# Patient Record
Sex: Male | Born: 1960 | Race: Black or African American | Hispanic: No | Marital: Married | Smoking: Former smoker
Health system: Southern US, Community
[De-identification: ages and names within clinical notes are randomized; demographics above are authoritative.]

## PROBLEM LIST (undated history)

## (undated) DIAGNOSIS — E785 Hyperlipidemia, unspecified: Secondary | ICD-10-CM

## (undated) DIAGNOSIS — I10 Essential (primary) hypertension: Secondary | ICD-10-CM

## (undated) DIAGNOSIS — R011 Cardiac murmur, unspecified: Secondary | ICD-10-CM

## (undated) DIAGNOSIS — M1990 Unspecified osteoarthritis, unspecified site: Secondary | ICD-10-CM

## (undated) DIAGNOSIS — K429 Umbilical hernia without obstruction or gangrene: Secondary | ICD-10-CM

## (undated) HISTORY — DX: Hyperlipidemia, unspecified: E78.5

## (undated) HISTORY — PX: APPENDECTOMY: SHX54

## (undated) HISTORY — DX: Unspecified osteoarthritis, unspecified site: M19.90

---

## 2002-05-10 ENCOUNTER — Emergency Department (HOSPITAL_COMMUNITY): Admission: EM | Admit: 2002-05-10 | Discharge: 2002-05-10 | Payer: Self-pay | Admitting: Internal Medicine

## 2002-05-10 ENCOUNTER — Encounter: Payer: Self-pay | Admitting: Internal Medicine

## 2003-09-27 ENCOUNTER — Ambulatory Visit (HOSPITAL_COMMUNITY): Admission: RE | Admit: 2003-09-27 | Discharge: 2003-09-27 | Payer: Self-pay | Admitting: Family Medicine

## 2003-09-27 ENCOUNTER — Encounter: Payer: Self-pay | Admitting: Family Medicine

## 2006-12-08 ENCOUNTER — Ambulatory Visit: Payer: Self-pay | Admitting: Internal Medicine

## 2006-12-08 ENCOUNTER — Ambulatory Visit (HOSPITAL_COMMUNITY): Admission: RE | Admit: 2006-12-08 | Discharge: 2006-12-08 | Payer: Self-pay | Admitting: Internal Medicine

## 2006-12-08 HISTORY — PX: COLONOSCOPY: SHX174

## 2009-02-19 ENCOUNTER — Ambulatory Visit (HOSPITAL_COMMUNITY): Admission: RE | Admit: 2009-02-19 | Discharge: 2009-02-19 | Payer: Self-pay | Admitting: Family Medicine

## 2009-02-26 ENCOUNTER — Ambulatory Visit (HOSPITAL_COMMUNITY): Admission: RE | Admit: 2009-02-26 | Discharge: 2009-02-26 | Payer: Self-pay | Admitting: Family Medicine

## 2009-02-26 ENCOUNTER — Encounter: Payer: Self-pay | Admitting: Orthopedic Surgery

## 2009-03-13 ENCOUNTER — Ambulatory Visit: Payer: Self-pay | Admitting: Orthopedic Surgery

## 2009-03-13 DIAGNOSIS — M25559 Pain in unspecified hip: Secondary | ICD-10-CM

## 2011-05-07 NOTE — Op Note (Signed)
NAME:  Billy Bowman, Billy Bowman                  ACCOUNT NO.:  [id number]   MEDICAL RECORD NO.:  [id number]          PATIENT TYPE:  AMB   LOCATION:  DAY                           FACILITY:  [facility]   PHYSICIAN:  R. Roetta Sessions, M.D. DATE OF BIRTH:  1961/04/18   DATE OF PROCEDURE:  12/08/2006  DATE OF DISCHARGE:                               OPERATIVE REPORT   PROCEDURE:  Screening colonoscopy.   INDICATIONS FOR PROCEDURE:  Patient is a 50 year old gentleman with a  positive family history of colon cancer in two first degree relatives.  He had a colonoscopy five years ago by Dr. Katrinka Blazing where he had polyps  removed.  He is having no lower GI tract symptoms.  Colonoscopy is now  being done as a high risk screening maneuver.  This approach has been  discussed with the patient at length.  The potential risks, benefits and  alternatives have been reviewed and questions answered.  Please see  documentation in medical record.   PROCEDURE NOTE:  The O2 saturation, blood pressure, pulse, respirations  were monitored throughout the entire procedure.   CONSCIOUS SEDATION:  1. Versed 5 mg IV.  2. Demerol 125 mg IV in divided doses.   INSTRUMENT:  Pentax video chip system.   FINDINGS:  Digital rectal exam revealed no abnormalities.   ENDOSCOPIC FINDINGS:  The prep was good.   COLON:  Colonic mucosa was surveyed from the rectosigmoid junction  through the left transverse, right colon, the appendiceal orifice,  ileocecal valve and cecum.  These structures were well seen and  photographed for the record.  From this level the scope was slowly  withdrawn.  All previously mentioned mucosal surfaces were again seen.  The colonic mucosa appeared normal.  The scope was pulled down into the  rectum where a thorough examination of the rectal mucosa including a  retroflexed view of the anal verge revealed normal rectal mucosa.  The  patient tolerated the procedure well, was reactive to endoscopy.   IMPRESSION:  1. Normal rectum.  2. Normal colon.   RECOMMENDATIONS:  Repeat high risk screening colonoscopy in 5 years.      Jonathon Bellows, M.D.  Electronically Signed     RMR/MEDQ  D:  12/08/2006  T:  12/08/2006  Job:  [id number]   cc:   Mila Homer. Sudie Bailey, M.D.  Fax: [phone]

## 2011-10-12 ENCOUNTER — Encounter: Payer: Self-pay | Admitting: Internal Medicine

## 2012-01-12 ENCOUNTER — Telehealth: Payer: Self-pay

## 2012-01-12 NOTE — Telephone Encounter (Signed)
Pt referred from Dr. Sudie Bailey for a colonoscopy. Last one done by [facility] on 12/08/2006. ( Per Dr. Jena Gauss, pt was to repeat high risk screening in 5 years. ( Family hx of colon cancer in two first degree relatives.) Called and LMOM on the mobile phone, and also the house number listed on the paper chart of [phone].

## 2012-01-20 NOTE — Telephone Encounter (Signed)
LMOM to call. Needs OV prior to colonoscopy.

## 2012-01-20 NOTE — Telephone Encounter (Signed)
Letter mailed for pt to call.  

## 2012-02-01 ENCOUNTER — Encounter: Payer: Self-pay | Admitting: Internal Medicine

## 2012-02-02 ENCOUNTER — Ambulatory Visit: Payer: Self-pay | Admitting: Gastroenterology

## 2012-06-14 ENCOUNTER — Other Ambulatory Visit: Payer: Self-pay | Admitting: Family Medicine

## 2012-06-14 DIAGNOSIS — M5430 Sciatica, unspecified side: Secondary | ICD-10-CM

## 2012-06-20 ENCOUNTER — Other Ambulatory Visit: Payer: Self-pay | Admitting: Family Medicine

## 2012-06-20 ENCOUNTER — Ambulatory Visit
Admission: RE | Admit: 2012-06-20 | Discharge: 2012-06-20 | Disposition: A | Discharge status: Home / Self Care | Payer: BC Managed Care – PPO | Source: Ambulatory Visit | Attending: Family Medicine | Admitting: Family Medicine

## 2012-06-20 VITALS — BP 141/72 | HR 58

## 2012-06-20 DIAGNOSIS — M5430 Sciatica, unspecified side: Secondary | ICD-10-CM

## 2012-06-20 DIAGNOSIS — M25559 Pain in unspecified hip: Secondary | ICD-10-CM

## 2012-06-20 DIAGNOSIS — M549 Dorsalgia, unspecified: Secondary | ICD-10-CM

## 2012-06-20 MED ORDER — METHYLPREDNISOLONE ACETATE 40 MG/ML INJ SUSP (RADIOLOG
120.0000 mg | Freq: Once | INTRAMUSCULAR | Status: AC
  Administered 2012-06-20: 120 mg via EPIDURAL

## 2012-06-20 MED ORDER — IOHEXOL 180 MG/ML  SOLN
1.0000 mL | Freq: Once | INTRAMUSCULAR | Status: AC | PRN
  Administered 2012-06-20: 1 mL via EPIDURAL

## 2012-06-20 NOTE — Discharge Instructions (Signed)

## 2012-07-04 ENCOUNTER — Ambulatory Visit
Admission: RE | Admit: 2012-07-04 | Discharge: 2012-07-04 | Disposition: A | Discharge status: Home / Self Care | Payer: BC Managed Care – PPO | Source: Ambulatory Visit | Attending: Family Medicine | Admitting: Family Medicine

## 2012-07-04 VITALS — BP 181/90 | HR 59

## 2012-07-04 DIAGNOSIS — M549 Dorsalgia, unspecified: Secondary | ICD-10-CM

## 2012-07-04 MED ORDER — METHYLPREDNISOLONE ACETATE 40 MG/ML INJ SUSP (RADIOLOG
120.0000 mg | Freq: Once | INTRAMUSCULAR | Status: AC
  Administered 2012-07-04: 120 mg via EPIDURAL

## 2012-07-04 MED ORDER — IOHEXOL 180 MG/ML  SOLN
1.0000 mL | Freq: Once | INTRAMUSCULAR | Status: AC | PRN
  Administered 2012-07-04: 1 mL via EPIDURAL

## 2012-07-12 ENCOUNTER — Other Ambulatory Visit: Payer: Self-pay | Admitting: Family Medicine

## 2012-07-12 DIAGNOSIS — M549 Dorsalgia, unspecified: Secondary | ICD-10-CM

## 2012-07-18 ENCOUNTER — Ambulatory Visit
Admission: RE | Admit: 2012-07-18 | Discharge: 2012-07-18 | Disposition: A | Discharge status: Home / Self Care | Payer: BC Managed Care – PPO | Source: Ambulatory Visit | Attending: Family Medicine | Admitting: Family Medicine

## 2012-07-18 ENCOUNTER — Other Ambulatory Visit: Payer: Self-pay | Admitting: Family Medicine

## 2012-07-18 VITALS — BP 161/87

## 2012-07-18 DIAGNOSIS — M549 Dorsalgia, unspecified: Secondary | ICD-10-CM

## 2012-07-18 MED ORDER — IOHEXOL 180 MG/ML  SOLN
1.0000 mL | Freq: Once | INTRAMUSCULAR | Status: AC | PRN
  Administered 2012-07-18: 1 mL via EPIDURAL

## 2012-07-18 MED ORDER — METHYLPREDNISOLONE ACETATE 40 MG/ML INJ SUSP (RADIOLOG
120.0000 mg | Freq: Once | INTRAMUSCULAR | Status: AC
  Administered 2012-07-18: 120 mg via EPIDURAL

## 2012-07-18 NOTE — Progress Notes (Signed)
Pt requested another copy of the discharge instructions.

## 2012-10-23 ENCOUNTER — Other Ambulatory Visit (HOSPITAL_COMMUNITY): Payer: Self-pay | Admitting: Family Medicine

## 2012-10-23 DIAGNOSIS — M5430 Sciatica, unspecified side: Secondary | ICD-10-CM

## 2012-10-25 ENCOUNTER — Ambulatory Visit (HOSPITAL_COMMUNITY)
Admission: RE | Admit: 2012-10-25 | Discharge: 2012-10-25 | Disposition: A | Discharge status: Home / Self Care | Payer: BC Managed Care – PPO | Source: Ambulatory Visit | Attending: Family Medicine | Admitting: Family Medicine

## 2012-10-25 DIAGNOSIS — M25559 Pain in unspecified hip: Secondary | ICD-10-CM | POA: Insufficient documentation

## 2012-10-25 DIAGNOSIS — M5430 Sciatica, unspecified side: Secondary | ICD-10-CM | POA: Insufficient documentation

## 2012-11-01 ENCOUNTER — Other Ambulatory Visit: Payer: Self-pay | Admitting: Family Medicine

## 2012-11-01 DIAGNOSIS — M5430 Sciatica, unspecified side: Secondary | ICD-10-CM

## 2012-11-14 ENCOUNTER — Other Ambulatory Visit: Payer: Self-pay | Admitting: Neurosurgery

## 2012-11-23 ENCOUNTER — Encounter (HOSPITAL_COMMUNITY): Payer: Self-pay | Admitting: Pharmacy Technician

## 2012-11-23 ENCOUNTER — Encounter (HOSPITAL_COMMUNITY)
Admission: RE | Admit: 2012-11-23 | Discharge: 2012-11-23 | Disposition: A | Discharge status: Home / Self Care | Payer: BC Managed Care – PPO | Source: Ambulatory Visit | Attending: Anesthesiology | Admitting: Anesthesiology

## 2012-11-23 ENCOUNTER — Encounter (HOSPITAL_COMMUNITY)
Admission: RE | Admit: 2012-11-23 | Discharge: 2012-11-23 | Disposition: A | Discharge status: Home / Self Care | Payer: BC Managed Care – PPO | Source: Ambulatory Visit | Attending: Neurosurgery | Admitting: Neurosurgery

## 2012-11-23 ENCOUNTER — Encounter (HOSPITAL_COMMUNITY): Payer: Self-pay

## 2012-11-23 HISTORY — DX: Essential (primary) hypertension: I10

## 2012-11-23 LAB — SURGICAL PCR SCREEN
MRSA, PCR: NEGATIVE
Staphylococcus aureus: NEGATIVE

## 2012-11-23 LAB — BASIC METABOLIC PANEL
Chloride: 101 mEq/L (ref 96–112)
GFR calc Af Amer: 67 mL/min — ABNORMAL LOW (ref >90)
GFR calc non Af Amer: 57 mL/min — ABNORMAL LOW (ref >90)
Potassium: 5.1 mEq/L (ref 3.5–5.1)
Sodium: 138 mEq/L (ref 135–145)

## 2012-11-23 LAB — CBC
HCT: 43.7 % (ref 39.0–52.0)
Hemoglobin: 15.2 g/dL (ref 13.0–17.0)
RBC: 5.07 MIL/uL (ref 4.22–5.81)
WBC: 10.1 10*3/uL (ref 4.0–10.5)

## 2012-11-23 NOTE — Progress Notes (Signed)
Dr. Lonie Peak office notified that orders need to be signed.

## 2012-11-23 NOTE — Pre-Procedure Instructions (Addendum)
20 Billy Bowman  11/23/2012   Your procedure is scheduled on:  11-27-2012  Report to [facility] at 10:20 AM  Call this number if you have problems the morning of surgery: [phone]   Remember:   Do not eat food or drink:After Midnight.      Take these medicines the morning of surgery with A SIP OF WATER: diltiazem(Dilacor),pain medication as needed,Metoprolol(Toprol-XL)   Do not wear jewelry  Do not wear lotions, powders, or perfumes.  Do not shave 48 hours prior to surgery. Men may shave face and neck.  Do not bring valuables to the hospital.  Contacts, dentures or bridgework may not be worn into surgery.  Leave suitcase in the car. After surgery it may be brought to your room.   For patients admitted to the hospital, checkout time is 11:00 AM the day of discharge.   Patients discharged the day of surgery will not be allowed to drive home.     Special Instructions: Shower using CHG 2 nights before surgery and the night before surgery.  If you shower the day of surgery use CHG.  Use special wash - you have one bottle of CHG for all showers.  You should use approximately 1/3 of the bottle for each shower.     Please read over the following fact sheets that you were given: Pain Booklet, Coughing and Deep Breathing, MRSA Information and Surgical Site Infection Prevention

## 2012-11-23 NOTE — Progress Notes (Signed)
Sleep apnea

## 2012-11-23 NOTE — Progress Notes (Signed)
This patient has screened at an elevated risk for obstructive sleep apnea using the Stop-bang tool during a pre-surgical visit.

## 2012-11-27 ENCOUNTER — Encounter (HOSPITAL_COMMUNITY): Payer: Self-pay | Admitting: *Deleted

## 2012-11-27 ENCOUNTER — Ambulatory Visit (HOSPITAL_COMMUNITY): Payer: BC Managed Care – PPO | Admitting: Certified Registered"

## 2012-11-27 ENCOUNTER — Ambulatory Visit (HOSPITAL_COMMUNITY): Payer: BC Managed Care – PPO

## 2012-11-27 ENCOUNTER — Encounter (HOSPITAL_COMMUNITY): Payer: Self-pay | Admitting: Certified Registered"

## 2012-11-27 ENCOUNTER — Encounter (HOSPITAL_COMMUNITY)
Admission: RE | Disposition: A | Discharge status: Home / Self Care | Payer: Self-pay | Source: Ambulatory Visit | Attending: Neurosurgery

## 2012-11-27 ENCOUNTER — Ambulatory Visit (HOSPITAL_COMMUNITY)
Admission: RE | Admit: 2012-11-27 | Discharge: 2012-11-28 | Disposition: A | Discharge status: Home / Self Care | Payer: BC Managed Care – PPO | Source: Ambulatory Visit | Attending: Neurosurgery | Admitting: Neurosurgery

## 2012-11-27 DIAGNOSIS — Z01818 Encounter for other preprocedural examination: Secondary | ICD-10-CM | POA: Insufficient documentation

## 2012-11-27 DIAGNOSIS — M5126 Other intervertebral disc displacement, lumbar region: Secondary | ICD-10-CM | POA: Insufficient documentation

## 2012-11-27 DIAGNOSIS — Z01810 Encounter for preprocedural cardiovascular examination: Secondary | ICD-10-CM | POA: Insufficient documentation

## 2012-11-27 DIAGNOSIS — Z01812 Encounter for preprocedural laboratory examination: Secondary | ICD-10-CM | POA: Insufficient documentation

## 2012-11-27 DIAGNOSIS — I10 Essential (primary) hypertension: Secondary | ICD-10-CM | POA: Insufficient documentation

## 2012-11-27 HISTORY — PX: LUMBAR LAMINECTOMY/DECOMPRESSION MICRODISCECTOMY: SHX5026

## 2012-11-27 SURGERY — LUMBAR LAMINECTOMY/DECOMPRESSION MICRODISCECTOMY 1 LEVEL
Anesthesia: General | Site: Back | Laterality: Right | Wound class: Clean

## 2012-11-27 MED ORDER — OXYCODONE-ACETAMINOPHEN 5-325 MG PO TABS: 1.0000 | ORAL_TABLET | ORAL | Status: DC | PRN

## 2012-11-27 MED ORDER — LIDOCAINE-EPINEPHRINE 1 %-1:100000 IJ SOLN
INTRAMUSCULAR | Status: DC | PRN
  Administered 2012-11-27: 10 mL

## 2012-11-27 MED ORDER — PHENOL 1.4 % MT LIQD: 1.0000 | OROMUCOSAL | Status: DC | PRN

## 2012-11-27 MED ORDER — SODIUM CHLORIDE 0.9 % IJ SOLN: 3.0000 mL | INTRAMUSCULAR | Status: DC | PRN

## 2012-11-27 MED ORDER — ACETAMINOPHEN 325 MG PO TABS: 650.0000 mg | ORAL_TABLET | ORAL | Status: DC | PRN

## 2012-11-27 MED ORDER — ACETAMINOPHEN 650 MG RE SUPP: 650.0000 mg | RECTAL | Status: DC | PRN

## 2012-11-27 MED ORDER — BACITRACIN 50000 UNITS IM SOLR
INTRAMUSCULAR | Status: AC
  Filled 2012-11-27: qty 1

## 2012-11-27 MED ORDER — LIDOCAINE HCL (CARDIAC) 20 MG/ML IV SOLN
INTRAVENOUS | Status: DC | PRN
  Administered 2012-11-27: 100 mg via INTRAVENOUS

## 2012-11-27 MED ORDER — CEFAZOLIN SODIUM-DEXTROSE 2-3 GM-% IV SOLR
INTRAVENOUS | Status: AC
  Filled 2012-11-27: qty 50

## 2012-11-27 MED ORDER — DILTIAZEM HCL ER 240 MG PO CP24
240.0000 mg | ORAL_CAPSULE | Freq: Every day | ORAL | Status: DC
  Filled 2012-11-27 (×2): qty 1

## 2012-11-27 MED ORDER — LACTATED RINGERS IV SOLN
INTRAVENOUS | Status: DC | PRN
  Administered 2012-11-27: 12:00:00 via INTRAVENOUS

## 2012-11-27 MED ORDER — SODIUM CHLORIDE 0.9 % IJ SOLN
3.0000 mL | Freq: Two times a day (BID) | INTRAMUSCULAR | Status: DC
  Administered 2012-11-27: 3 mL via INTRAVENOUS

## 2012-11-27 MED ORDER — HEMOSTATIC AGENTS (NO CHARGE) OPTIME
TOPICAL | Status: DC | PRN
  Administered 2012-11-27: 1 via TOPICAL

## 2012-11-27 MED ORDER — OXYCODONE HCL 5 MG/5ML PO SOLN: 5.0000 mg | Freq: Once | ORAL | Status: DC | PRN

## 2012-11-27 MED ORDER — FENTANYL CITRATE 0.05 MG/ML IJ SOLN
INTRAMUSCULAR | Status: DC | PRN
  Administered 2012-11-27: 50 ug via INTRAVENOUS
  Administered 2012-11-27: 100 ug via INTRAVENOUS

## 2012-11-27 MED ORDER — CEFAZOLIN SODIUM-DEXTROSE 2-3 GM-% IV SOLR
2.0000 g | Freq: Once | INTRAVENOUS | Status: AC
  Administered 2012-11-27: 2 g via INTRAVENOUS
  Filled 2012-11-27: qty 50

## 2012-11-27 MED ORDER — MENTHOL 3 MG MT LOZG: 1.0000 | LOZENGE | OROMUCOSAL | Status: DC | PRN

## 2012-11-27 MED ORDER — HYDROMORPHONE HCL PF 1 MG/ML IJ SOLN
INTRAMUSCULAR | Status: AC
  Filled 2012-11-27: qty 2

## 2012-11-27 MED ORDER — ACETAMINOPHEN 10 MG/ML IV SOLN
INTRAVENOUS | Status: AC
  Administered 2012-11-27: 1000 mg via INTRAVENOUS
  Filled 2012-11-27: qty 100

## 2012-11-27 MED ORDER — LISINOPRIL 20 MG PO TABS
20.0000 mg | ORAL_TABLET | Freq: Every day | ORAL | Status: DC
  Filled 2012-11-27 (×2): qty 1

## 2012-11-27 MED ORDER — HYDROCHLOROTHIAZIDE 25 MG PO TABS
25.0000 mg | ORAL_TABLET | Freq: Every day | ORAL | Status: DC
  Filled 2012-11-27 (×2): qty 1

## 2012-11-27 MED ORDER — SODIUM CHLORIDE 0.9 % IV SOLN
250.0000 mL | INTRAVENOUS | Status: DC
  Administered 2012-11-28: 250 mL via INTRAVENOUS

## 2012-11-27 MED ORDER — CEFAZOLIN SODIUM 1-5 GM-% IV SOLN
1.0000 g | Freq: Three times a day (TID) | INTRAVENOUS | Status: AC
  Administered 2012-11-27 – 2012-11-28 (×2): 1 g via INTRAVENOUS
  Filled 2012-11-27 (×2): qty 50

## 2012-11-27 MED ORDER — ACETAMINOPHEN 10 MG/ML IV SOLN
INTRAVENOUS | Status: AC
  Filled 2012-11-27: qty 100

## 2012-11-27 MED ORDER — ARTIFICIAL TEARS OP OINT
TOPICAL_OINTMENT | OPHTHALMIC | Status: DC | PRN
  Administered 2012-11-27: 1 via OPHTHALMIC

## 2012-11-27 MED ORDER — EPHEDRINE SULFATE 50 MG/ML IJ SOLN
INTRAMUSCULAR | Status: DC | PRN
  Administered 2012-11-27: 5 mg via INTRAVENOUS
  Administered 2012-11-27 (×3): 10 mg via INTRAVENOUS
  Administered 2012-11-27: 5 mg via INTRAVENOUS

## 2012-11-27 MED ORDER — SIMVASTATIN 5 MG PO TABS
5.0000 mg | ORAL_TABLET | Freq: Every day | ORAL | Status: DC
  Filled 2012-11-27 (×2): qty 1

## 2012-11-27 MED ORDER — SODIUM CHLORIDE 0.9 % IR SOLN
Status: DC | PRN
  Administered 2012-11-27: 12:00:00

## 2012-11-27 MED ORDER — PROMETHAZINE HCL 25 MG/ML IJ SOLN: 6.2500 mg | INTRAMUSCULAR | Status: DC | PRN

## 2012-11-27 MED ORDER — PROPOFOL 10 MG/ML IV BOLUS
INTRAVENOUS | Status: DC | PRN
  Administered 2012-11-27: 200 mg via INTRAVENOUS
  Administered 2012-11-27: 30 mg via INTRAVENOUS

## 2012-11-27 MED ORDER — ONDANSETRON HCL 4 MG/2ML IJ SOLN
4.0000 mg | INTRAMUSCULAR | Status: DC | PRN
  Administered 2012-11-27: 4 mg via INTRAVENOUS
  Filled 2012-11-27: qty 2

## 2012-11-27 MED ORDER — DOCUSATE SODIUM 100 MG PO CAPS
100.0000 mg | ORAL_CAPSULE | Freq: Two times a day (BID) | ORAL | Status: DC
  Filled 2012-11-27: qty 1

## 2012-11-27 MED ORDER — CYCLOBENZAPRINE HCL 10 MG PO TABS: 10.0000 mg | ORAL_TABLET | Freq: Three times a day (TID) | ORAL | Status: DC | PRN

## 2012-11-27 MED ORDER — SODIUM CHLORIDE 0.9 % IV SOLN
INTRAVENOUS | Status: AC
  Filled 2012-11-27: qty 500

## 2012-11-27 MED ORDER — VECURONIUM BROMIDE 10 MG IV SOLR
INTRAVENOUS | Status: DC | PRN
Start: 2012-11-27 — End: 2012-11-27
  Administered 2012-11-27: 2 mg via INTRAVENOUS

## 2012-11-27 MED ORDER — ONDANSETRON HCL 4 MG/2ML IJ SOLN
INTRAMUSCULAR | Status: DC | PRN
  Administered 2012-11-27: 4 mg via INTRAVENOUS

## 2012-11-27 MED ORDER — GLYCOPYRROLATE 0.2 MG/ML IJ SOLN
INTRAMUSCULAR | Status: DC | PRN
  Administered 2012-11-27: .8 mg via INTRAVENOUS

## 2012-11-27 MED ORDER — HYDROMORPHONE HCL PF 1 MG/ML IJ SOLN
0.2500 mg | INTRAMUSCULAR | Status: DC | PRN
  Administered 2012-11-27 (×4): 0.5 mg via INTRAVENOUS

## 2012-11-27 MED ORDER — OXYCODONE HCL 5 MG PO TABS: 5.0000 mg | ORAL_TABLET | Freq: Once | ORAL | Status: DC | PRN

## 2012-11-27 MED ORDER — ROCURONIUM BROMIDE 100 MG/10ML IV SOLN
INTRAVENOUS | Status: DC | PRN
  Administered 2012-11-27: 50 mg via INTRAVENOUS

## 2012-11-27 MED ORDER — MIDAZOLAM HCL 5 MG/5ML IJ SOLN
INTRAMUSCULAR | Status: DC | PRN
  Administered 2012-11-27: 2 mg via INTRAVENOUS

## 2012-11-27 MED ORDER — NEOSTIGMINE METHYLSULFATE 1 MG/ML IJ SOLN
INTRAMUSCULAR | Status: DC | PRN
  Administered 2012-11-27: 5 mg via INTRAVENOUS

## 2012-11-27 MED ORDER — THROMBIN 5000 UNITS EX SOLR
CUTANEOUS | Status: DC | PRN
  Administered 2012-11-27 (×2): 5000 [IU] via TOPICAL

## 2012-11-27 MED ORDER — METOPROLOL SUCCINATE ER 50 MG PO TB24
50.0000 mg | ORAL_TABLET | Freq: Every day | ORAL | Status: DC
  Filled 2012-11-27 (×2): qty 1

## 2012-11-27 MED ORDER — METOPROLOL SUCCINATE ER 50 MG PO TB24
50.0000 mg | ORAL_TABLET | Freq: Once | ORAL | Status: AC
  Administered 2012-11-27: 50 mg via ORAL
  Filled 2012-11-27: qty 1

## 2012-11-27 MED ORDER — HYDROMORPHONE HCL PF 1 MG/ML IJ SOLN
0.5000 mg | INTRAMUSCULAR | Status: DC | PRN
  Administered 2012-11-27 – 2012-11-28 (×2): 1 mg via INTRAVENOUS
  Filled 2012-11-27 (×2): qty 1

## 2012-11-27 MED ORDER — ACETAMINOPHEN 10 MG/ML IV SOLN
1000.0000 mg | Freq: Four times a day (QID) | INTRAVENOUS | Status: DC
  Administered 2012-11-27 – 2012-11-28 (×3): 1000 mg via INTRAVENOUS
  Filled 2012-11-27 (×4): qty 100

## 2012-11-27 MED ORDER — HYDROCODONE-ACETAMINOPHEN 5-325 MG PO TABS: 2.0000 | ORAL_TABLET | ORAL | Status: DC | PRN

## 2012-11-27 SURGICAL SUPPLY — 57 items
BAG DECANTER FOR FLEXI CONT (MISCELLANEOUS) ×2 IMPLANT
BENZOIN TINCTURE PRP APPL 2/3 (GAUZE/BANDAGES/DRESSINGS) ×2 IMPLANT
BLADE SURG 11 STRL SS (BLADE) ×2 IMPLANT
BLADE SURG ROTATE 9660 (MISCELLANEOUS) IMPLANT
BRUSH SCRUB EZ PLAIN DRY (MISCELLANEOUS) ×2 IMPLANT
BUR MATCHSTICK NEURO 3.0 LAGG (BURR) ×2 IMPLANT
BUR PRECISION FLUTE 6.0 (BURR) ×2 IMPLANT
CANISTER SUCTION 2500CC (MISCELLANEOUS) ×2 IMPLANT
CLOTH BEACON ORANGE TIMEOUT ST (SAFETY) ×2 IMPLANT
CONT SPEC 4OZ CLIKSEAL STRL BL (MISCELLANEOUS) ×2 IMPLANT
DECANTER SPIKE VIAL GLASS SM (MISCELLANEOUS) ×2 IMPLANT
DERMABOND ADVANCED (GAUZE/BANDAGES/DRESSINGS) ×1
DERMABOND ADVANCED .7 DNX12 (GAUZE/BANDAGES/DRESSINGS) ×1 IMPLANT
DRAPE LAPAROTOMY 100X72X124 (DRAPES) ×2 IMPLANT
DRAPE POUCH INSTRU U-SHP 10X18 (DRAPES) ×2 IMPLANT
DRAPE PROXIMA HALF (DRAPES) IMPLANT
DRAPE SURG 17X23 STRL (DRAPES) ×2 IMPLANT
DRSG OPSITE 4X5.5 SM (GAUZE/BANDAGES/DRESSINGS) ×2 IMPLANT
ELECT BLADE 4.0 EZ CLEAN MEGAD (MISCELLANEOUS) ×2
ELECT REM PT RETURN 9FT ADLT (ELECTROSURGICAL) ×2
ELECTRODE BLDE 4.0 EZ CLN MEGD (MISCELLANEOUS) ×1 IMPLANT
ELECTRODE REM PT RTRN 9FT ADLT (ELECTROSURGICAL) ×1 IMPLANT
GAUZE SPONGE 4X4 16PLY XRAY LF (GAUZE/BANDAGES/DRESSINGS) IMPLANT
GLOVE BIO SURGEON STRL SZ8 (GLOVE) ×4 IMPLANT
GLOVE BIOGEL PI IND STRL 7.0 (GLOVE) ×1 IMPLANT
GLOVE BIOGEL PI IND STRL 7.5 (GLOVE) ×1 IMPLANT
GLOVE BIOGEL PI INDICATOR 7.0 (GLOVE) ×1
GLOVE BIOGEL PI INDICATOR 7.5 (GLOVE) ×1
GLOVE ECLIPSE 7.5 STRL STRAW (GLOVE) ×2 IMPLANT
GLOVE EXAM NITRILE LRG STRL (GLOVE) IMPLANT
GLOVE EXAM NITRILE MD LF STRL (GLOVE) ×2 IMPLANT
GLOVE EXAM NITRILE XL STR (GLOVE) IMPLANT
GLOVE EXAM NITRILE XS STR PU (GLOVE) IMPLANT
GLOVE INDICATOR 8.5 STRL (GLOVE) ×2 IMPLANT
GLOVE OPTIFIT SS 6.5 STRL BRWN (GLOVE) ×4 IMPLANT
GLOVE OPTIFIT SS 8.5 STRL (GLOVE) ×2 IMPLANT
GOWN BRE IMP SLV AUR LG STRL (GOWN DISPOSABLE) ×2 IMPLANT
GOWN BRE IMP SLV AUR XL STRL (GOWN DISPOSABLE) ×4 IMPLANT
GOWN STRL REIN 2XL LVL4 (GOWN DISPOSABLE) IMPLANT
KIT BASIN OR (CUSTOM PROCEDURE TRAY) ×2 IMPLANT
KIT ROOM TURNOVER OR (KITS) ×2 IMPLANT
NEEDLE HYPO 22GX1.5 SAFETY (NEEDLE) ×2 IMPLANT
NEEDLE SPNL 22GX3.5 QUINCKE BK (NEEDLE) ×2 IMPLANT
NS IRRIG 1000ML POUR BTL (IV SOLUTION) ×2 IMPLANT
PACK LAMINECTOMY NEURO (CUSTOM PROCEDURE TRAY) ×2 IMPLANT
RUBBERBAND STERILE (MISCELLANEOUS) ×4 IMPLANT
SPONGE GAUZE 4X4 12PLY (GAUZE/BANDAGES/DRESSINGS) ×2 IMPLANT
SPONGE SURGIFOAM ABS GEL SZ50 (HEMOSTASIS) ×2 IMPLANT
STRIP CLOSURE SKIN 1/2X4 (GAUZE/BANDAGES/DRESSINGS) ×2 IMPLANT
SUT VIC AB 0 CT1 18XCR BRD8 (SUTURE) ×1 IMPLANT
SUT VIC AB 0 CT1 8-18 (SUTURE) ×1
SUT VIC AB 2-0 CT1 18 (SUTURE) ×2 IMPLANT
SUT VICRYL 4-0 PS2 18IN ABS (SUTURE) ×2 IMPLANT
SYR 20ML ECCENTRIC (SYRINGE) ×2 IMPLANT
TOWEL OR 17X24 6PK STRL BLUE (TOWEL DISPOSABLE) ×2 IMPLANT
TOWEL OR 17X26 10 PK STRL BLUE (TOWEL DISPOSABLE) ×2 IMPLANT
WATER STERILE IRR 1000ML POUR (IV SOLUTION) ×2 IMPLANT

## 2012-11-27 NOTE — Preoperative (Signed)
Beta Blockers   Reason not to administer Beta Blockers:Not Applicable, last dose 11/27/12 at 10:10 am

## 2012-11-27 NOTE — Op Note (Signed)
Preoperative diagnosis: Right L2-L3 radiculopathy from herniated nucleus pulposus L2-3 right  Postoperative diagnosis: Same  Procedure: Lumbar laminectomy and microdiscectomy L2-3 on the right with microscopic dissection of the L2 and L3 nerve root microscopic discectomy  Surgeon: Jillyn Hidden Laniqua Torrens  Assistant: Tressie Stalker  Anesthesia: Gen.  EBL: Minimal  History of present illness: Patient is a pleasant [age] demonstrate progressive worsening back and initially right now left leg pain going down to sit into the front of his quad and middle part of his thigh workup with MRI scan showed herniated the stenosis at L2-3 on the right displacing the interest of the right L2 and medial aspect of the right L3 nerve root the patient takes her treatment imaging findings and progression clinical syndrome it is recommended limiting the discectomy from the right at L2-3 exits with a wrist nevus of the operation as well as perioperative course expectations about alternatives of surgery and he understands and agrees to proceed 4.  Operative procedure: Patient brought into the or was induced under general anesthesia positioned prone the Wilson frame his back was prepped and draped in routine sterile fashion preoperative localizing appropriate levels after infiltration 10 cc lidocaine with epi a midline incision was made and Bovie hours used to take down the subcutaneous tissue subperiosteal dissections care lamina of L2 and L3 on the right interoperative X. identify the appropriate level so the inferior aspect virtually entire aspirate the L2 lamina was drilled down a high-speed drill laminotomy was begun with a 3 minute Kerrison punch to expose ligament flavum which was removed in piecemeal fashion. At this point the operating microscope was draped and brought into the field him exclamation further under biting of the medial gutter was carried out superiorly to the level of the interest of the L2 pedicle immediately  presenting on the lateral aspect of canal into the dorsal area the canal was a large disc herniation that was teased within her foot several additional fragments were easily and fist out from underneath the L2 nerve root the L2 pedicles palpated the L2 nerve was dissected off of the L2 pedicle to confirm and feel up underneath it to root both proximally and distally to pedicle a marking inferiorly some additional fragments removed down closely level the disc space was of note was that the several large free fragments and remove a much larger than was visualized on the MRI scan of the disc spaces progression of clinical syndrome as the disc herniation extended from his last MRI. Ad this was the x-rays was carried down the interest of the L3 nerve root into the L3 pedicle the spaces in size there is no focal visible annulus tear so I did not cut further in the disc space after all this fragments and remove the thecal sac was widely decompressed into the or recovery were widely P. decompressed easily to foramen easily excepting a coronary.and hockey-stick. It was in to see her good fixing space was maintained Gelfoam was laid up the dura the muscle fascia proximal in layers with after Vicryl the skin was closed running 4 subcuticular benzoin structures were applied patient recovered in stable condition. R. counts and sponge counts were correct.

## 2012-11-27 NOTE — Plan of Care (Signed)
Problem: Consults Goal: Diagnosis - Spinal Surgery Outcome: Completed/Met Date Met:  11/27/12 Lumbar Laminectomy (Complex)

## 2012-11-27 NOTE — Anesthesia Postprocedure Evaluation (Signed)
Anesthesia Post Note  Patient: Billy Bowman  Procedure(s) Performed: Procedure(s) (LRB): LUMBAR LAMINECTOMY/DECOMPRESSION MICRODISCECTOMY 1 LEVEL (Right)  Anesthesia type: general  Patient location: PACU  Post pain: Pain level controlled  Post assessment: Patient's Cardiovascular Status Stable  Last Vitals:  Filed Vitals:   11/27/12 1415  BP:   Pulse: 99  Temp:   Resp: 18    Post vital signs: Reviewed and stable  Level of consciousness: sedated  Complications: No apparent anesthesia complications

## 2012-11-27 NOTE — Anesthesia Procedure Notes (Signed)
Procedure Name: Intubation Date/Time: 11/27/2012 12:18 PM Performed by: Jefm Miles E Pre-anesthesia Checklist: Patient identified, Timeout performed, Emergency Drugs available, Suction available and Patient being monitored Patient Re-evaluated:Patient Re-evaluated prior to inductionOxygen Delivery Method: Circle system utilized Preoxygenation: Pre-oxygenation with 100% oxygen Intubation Type: IV induction Ventilation: Mask ventilation without difficulty and Oral airway inserted - appropriate to patient size Laryngoscope Size: Mac and 3 Grade View: Grade I Tube type: Oral Tube size: 7.5 mm Number of attempts: 1 Airway Equipment and Method: Stylet Placement Confirmation: ETT inserted through vocal cords under direct vision,  breath sounds checked- equal and bilateral and positive ETCO2 Secured at: 23 cm Tube secured with: Tape Dental Injury: Teeth and Oropharynx as per pre-operative assessment

## 2012-11-27 NOTE — H&P (Signed)
Billy Bowman is an 51 y.o. male.   Chief Complaint: Back right hip and leg pain HPI: Patient is very pleasant 29 year old son has had back and right hip and leg pain rate down the front of his quad inside his thigh and is refractory to conservative treatment. I initially this began entirely in the right leg [a little bit over the left side plate his ankle workup with MRI scan revealed a large disc herniation L2-3 and the right displacement thecal sac and the right L2 and L3 nerve root with a free fragment migrating superiorly clear this correlate with syndrome it does not necessarily plan left leg pain however this is causing foraminal stenosis he has a proximal leg weakness in the right and persistent pain in is been refractory to all forms of conservative treatment I recommended laminectomy microdiscectomy L2-3 on the right.  Past Medical History  Diagnosis Date  . Hypertension     Past Surgical History  Procedure Date  . Colonoscopy 12/08/06    normal rectum/ normal colon  . Appendectomy     History reviewed. No pertinent family history. Social History:  reports that he has been smoking Cigarettes.  He has smoked for the past 20 years. He does not have any smokeless tobacco history on file. He reports that he drinks alcohol. He reports that he uses illicit drugs (Marijuana).  Allergies: No Known Allergies  Medications Prior to Admission  Medication Sig Dispense Refill  . diltiazem (DILACOR XR) 240 MG 24 hr capsule Take 240 mg by mouth daily.      . hydrochlorothiazide (HYDRODIURIL) 25 MG tablet Take 25 mg by mouth daily.      Marland Kitchen HYDROcodone-acetaminophen (LORCET) 10-650 MG per tablet Take 1 tablet by mouth every 6 (six) hours as needed. For pain      . lisinopril (PRINIVIL,ZESTRIL) 20 MG tablet Take 20 mg by mouth daily.      . metoprolol succinate (TOPROL-XL) 50 MG 24 hr tablet Take 50 mg by mouth daily. Take with or immediately following a meal.      . pravastatin (PRAVACHOL) 40 MG  tablet Take 40 mg by mouth daily.        No results found for this or any previous visit (from the past 48 hour(s)). No results found.  Review of Systems  Constitutional: Negative.   HENT: Negative.   Eyes: Negative.   Respiratory: Negative.   Cardiovascular: Positive for PND.  Gastrointestinal: Negative.   Genitourinary: Negative.   Musculoskeletal: Positive for back pain.  Skin: Negative.   Neurological: Positive for tingling and tremors.  Endo/Heme/Allergies: Negative.   Psychiatric/Behavioral: Negative.     Blood pressure 134/91, pulse 71, temperature 98.2 F (36.8 C), temperature source Oral, resp. rate 18, SpO2 97.00%. Physical Exam  Constitutional: He is oriented to person, place, and time. He appears well-developed and well-nourished.  HENT:  Head: Normocephalic.  Eyes: Pupils are equal, round, and reactive to light.  Neck: Normal range of motion.  Cardiovascular: Normal rate.   Respiratory: Effort normal.  GI: Soft.  Neurological: He is alert and oriented to person, place, and time. He has normal strength. GCS eye subscore is 4. GCS verbal subscore is 5. GCS motor subscore is 6.  Reflex Scores:      Patellar reflexes are 0 on the right side and 0 on the left side.      Achilles reflexes are 0 on the right side and 0 on the left side.  Strength is 5 out of 5 his iliopsoas, quads, hamstrings, gastric, into tibialis, EHL. Decreased sensation in his right quad and L2-L3 nerve root pattern     Assessment/Plan 51 year old patient who presents for a right L2-3 laminectomy discectomy  Markisha Meding P 11/27/2012, 11:46 AM

## 2012-11-27 NOTE — Transfer of Care (Signed)
Immediate Anesthesia Transfer of Care Note  Patient: Billy Bowman  Procedure(s) Performed: Procedure(s) (LRB) with comments: LUMBAR LAMINECTOMY/DECOMPRESSION MICRODISCECTOMY 1 LEVEL (Right) - right lumbar two three  Patient Location: PACU  Anesthesia Type:General  Level of Consciousness: awake, alert  and oriented  Airway & Oxygen Therapy: Patient Spontanous Breathing and Patient connected to nasal cannula oxygen  Post-op Assessment: Report given to PACU RN and Patient moving all extremities X 4  Post vital signs: Reviewed and stable  Complications: No apparent anesthesia complications

## 2012-11-27 NOTE — Anesthesia Preprocedure Evaluation (Addendum)
Anesthesia Evaluation  Patient identified by MRN, date of birth, ID band Patient awake    Reviewed: Allergy & Precautions, H&P , NPO status , Patient's Chart, lab work & pertinent test results, reviewed documented beta blocker date and time   Airway Mallampati: II TM Distance: >3 FB Neck ROM: Full    Dental  (+) Teeth Intact and Dental Advisory Given   Pulmonary    Pulmonary exam normal       Cardiovascular hypertension, Pt. on home beta blockers     Neuro/Psych    GI/Hepatic negative GI ROS, Neg liver ROS,   Endo/Other  Morbid obesity  Renal/GU negative Renal ROS     Musculoskeletal   Abdominal   Peds  Hematology   Anesthesia Other Findings   Reproductive/Obstetrics                          Anesthesia Physical Anesthesia Plan  ASA: II  Anesthesia Plan: General   Post-op Pain Management:    Induction: Intravenous  Airway Management Planned: Oral ETT  Additional Equipment:   Intra-op Plan:   Post-operative Plan: Extubation in OR  Informed Consent: I have reviewed the patients History and Physical, chart, labs and discussed the procedure including the risks, benefits and alternatives for the proposed anesthesia with the patient or authorized representative who has indicated his/her understanding and acceptance.   Dental advisory given  Plan Discussed with: CRNA, Anesthesiologist and Surgeon  Anesthesia Plan Comments:        Anesthesia Quick Evaluation

## 2012-11-28 ENCOUNTER — Encounter (HOSPITAL_COMMUNITY): Payer: Self-pay | Admitting: Neurosurgery

## 2012-11-28 MED ORDER — OXYCODONE-ACETAMINOPHEN 5-325 MG PO TABS: 1.0000 | ORAL_TABLET | ORAL | Status: DC | PRN

## 2012-11-28 MED ORDER — CYCLOBENZAPRINE HCL 10 MG PO TABS: 10.0000 mg | ORAL_TABLET | Freq: Three times a day (TID) | ORAL | Status: DC | PRN

## 2012-11-28 NOTE — Progress Notes (Signed)
Subjective: Patient reports Significant room in both lower 70 pain and still some numbness in his anterior quad  Objective: Vital signs in last 24 hours: Temp:  [97.2 F (36.2 C)-98.9 F (37.2 C)] 98.9 F (37.2 C) (12/10 0400) Pulse Rate:  [54-101] 64  (12/10 0400) Resp:  [14-20] 14  (12/10 0400) BP: (105-154)/(64-91) 111/73 mmHg (12/10 0400) SpO2:  [95 %-100 %] 96 % (12/10 0400)  Intake/Output from previous day: 12/09 0701 - 12/10 0700 In: 2120 [P.O.:720; I.V.:1400] Out: 201 [Emesis/NG output:1; Blood:200] Intake/Output this shift:    Strength out of 5 wound clean and dry  Lab Results: No results found for this basename: WBC:2,HGB:2,HCT:2,PLT:2 in the last 72 hours BMET No results found for this basename: NA:2,K:2,CL:2,CO2:2,GLUCOSE:2,BUN:2,CREATININE:2,CALCIUM:2 in the last 72 hours  Studies/Results: Dg Lumbar Spine 2-3 Views  11/28/2012  *RADIOLOGY REPORT*  Clinical Data: Microdiskectomy at L2-L3.  LUMBAR SPINE - 2-3 VIEW  Comparison: MRI 10/25/2012.  Findings: Two intraoperative lateral radiograph is submitted for interpretation.  The second image demonstrates soft tissue retractors dorsal to L2-L3 with a probe in the L2-L3 interspinous space.  IMPRESSION: Intraoperative localization at L2-L3.   Original Report Authenticated By: Andreas Newport, M.D.     Assessment/Plan: Discharge him  LOS: 1 day     Federico Maiorino P 11/28/2012, 7:36 AM

## 2012-11-28 NOTE — Discharge Summary (Signed)
  Physician Discharge Summary  Patient ID: MARCELLA DUNNAWAY MRN: [id number] DOB/AGE: 08/21/1961 51 y.o.  Admit date: 11/27/2012 Discharge date: 11/28/2012  Admission Diagnoses: Herniated nucleus pulposus L2-3 right  Discharge Diagnoses: Same Active Problems:  * No active hospital problems. *    Discharged Condition: good  Hospital Course: Patient is in the hospital underwent a laminectomy and discectomy L2 to the right postop patient did very well recovered in the floor on the floor patient was convalescing well ambulating and voiding spontaneously tolerating rigid diet was he'll be discharged home.  Consults: Significant Diagnostic Studies: Treatments: Right L2-3 laminectomy discectomy Discharge Exam: Blood pressure 111/73, pulse 64, temperature 98.9 F (37.2 C), temperature source Oral, resp. rate 14, SpO2 96.00%. Strength out of 5 wound clean and dry home  Disposition: Home     Medication List     As of 11/28/2012  7:38 AM    TAKE these medications         cyclobenzaprine 10 MG tablet   Commonly known as: FLEXERIL   Take 1 tablet (10 mg total) by mouth 3 (three) times daily as needed for muscle spasms.      diltiazem 240 MG 24 hr capsule   Commonly known as: DILACOR XR   Take 240 mg by mouth daily.      hydrochlorothiazide 25 MG tablet   Commonly known as: HYDRODIURIL   Take 25 mg by mouth daily.      HYDROcodone-acetaminophen 10-650 MG per tablet   Commonly known as: LORCET   Take 1 tablet by mouth every 6 (six) hours as needed. For pain      lisinopril 20 MG tablet   Commonly known as: PRINIVIL,ZESTRIL   Take 20 mg by mouth daily.      metoprolol succinate 50 MG 24 hr tablet   Commonly known as: TOPROL-XL   Take 50 mg by mouth daily. Take with or immediately following a meal.      oxyCODONE-acetaminophen 5-325 MG per tablet   Commonly known as: PERCOCET/ROXICET   Take 1-2 tablets by mouth every 4 (four) hours as needed.      pravastatin 40 MG tablet   Commonly known as: PRAVACHOL   Take 40 mg by mouth daily.         Signed: Jonesha Tsuchiya P 11/28/2012, 7:38 AM

## 2013-04-19 HISTORY — PX: HIP ARTHROPLASTY: SHX981

## 2013-10-11 ENCOUNTER — Other Ambulatory Visit: Payer: Self-pay | Admitting: Neurosurgery

## 2013-10-11 ENCOUNTER — Ambulatory Visit
Admission: RE | Admit: 2013-10-11 | Discharge: 2013-10-11 | Disposition: A | Discharge status: Home / Self Care | Payer: BC Managed Care – PPO | Source: Ambulatory Visit | Attending: Family Medicine | Admitting: Family Medicine

## 2013-10-11 VITALS — BP 140/87 | HR 65

## 2013-10-11 DIAGNOSIS — M5430 Sciatica, unspecified side: Secondary | ICD-10-CM

## 2013-10-11 DIAGNOSIS — M25552 Pain in left hip: Secondary | ICD-10-CM

## 2013-10-11 MED ORDER — METHYLPREDNISOLONE ACETATE 40 MG/ML INJ SUSP (RADIOLOG
120.0000 mg | Freq: Once | INTRAMUSCULAR | Status: AC
  Administered 2013-10-11: 120 mg via EPIDURAL

## 2013-10-11 MED ORDER — IOHEXOL 180 MG/ML  SOLN
1.0000 mL | Freq: Once | INTRAMUSCULAR | Status: AC | PRN
  Administered 2013-10-11: 1 mL via EPIDURAL

## 2013-11-13 ENCOUNTER — Other Ambulatory Visit: Payer: Self-pay | Admitting: Neurosurgery

## 2013-11-26 ENCOUNTER — Encounter (HOSPITAL_COMMUNITY)
Admission: RE | Admit: 2013-11-26 | Discharge: 2013-11-26 | Disposition: A | Discharge status: Home / Self Care | Payer: BC Managed Care – PPO | Source: Ambulatory Visit | Attending: Neurosurgery | Admitting: Neurosurgery

## 2013-11-26 ENCOUNTER — Encounter (HOSPITAL_COMMUNITY): Payer: Self-pay

## 2013-11-26 ENCOUNTER — Other Ambulatory Visit (HOSPITAL_COMMUNITY): Payer: Self-pay | Admitting: *Deleted

## 2013-11-26 HISTORY — DX: Cardiac murmur, unspecified: R01.1

## 2013-11-26 HISTORY — DX: Umbilical hernia without obstruction or gangrene: K42.9

## 2013-11-26 LAB — CBC
HCT: 43.2 % (ref 39.0–52.0)
Hemoglobin: 15 g/dL (ref 13.0–17.0)
MCH: 30.7 pg (ref 26.0–34.0)
MCHC: 34.7 g/dL (ref 30.0–36.0)
RBC: 4.88 MIL/uL (ref 4.22–5.81)
RDW: 16 % — ABNORMAL HIGH (ref 11.5–15.5)

## 2013-11-26 LAB — BASIC METABOLIC PANEL
BUN: 20 mg/dL (ref 6–23)
CO2: 24 mEq/L (ref 19–32)
Calcium: 9.5 mg/dL (ref 8.4–10.5)
Chloride: 103 mEq/L (ref 96–112)
Creatinine, Ser: 1.28 mg/dL (ref 0.50–1.35)
Glucose, Bld: 93 mg/dL (ref 70–99)
Sodium: 140 mEq/L (ref 135–145)

## 2013-11-26 NOTE — Pre-Procedure Instructions (Addendum)
NEILS SIRACUSA  11/26/2013   Your procedure is scheduled on:  11/28/13  Report to [facility] short stay admitting at 945 AM.  Call this number if you have problems the morning of surgery: [phone]   Remember:   Do not eat food or drink liquids after midnight.   Take these medicines the morning of surgery with A SIP OF WATER: diltiazem, metoprolol, pain med if needed         STOP all herbel meds, nsaids (aleve,naproxen,advil,ibuprofen) now including    Do not wear jewelry, make-up or nail polish.  Do not wear lotions, powders, or perfumes. You may wear deodorant.  Do not shave 48 hours prior to surgery. Men may shave face and neck.  Do not bring valuables to the hospital.  [facility] is not responsible                  for any belongings or valuables.               Contacts, dentures or bridgework may not be worn into surgery.  Leave suitcase in the car. After surgery it may be brought to your room.  For patients admitted to the hospital, discharge time is determined by your                treatment team.               Patients discharged the day of surgery will not be allowed to drive  home.  Name and phone number of your driver:   Special Instructions: Shower using CHG 2 nights before surgery and the night before surgery.  If you shower the day of surgery use CHG.  Use special wash - you have one bottle of CHG for all showers.  You should use approximately 1/3 of the bottle for each shower.   Please read over the following fact sheets that you were given: Pain Booklet, Coughing and Deep Breathing, MRSA Information and Surgical Site Infection Prevention

## 2013-11-26 NOTE — Progress Notes (Signed)
req'd cxr from 5/14 [facility]

## 2013-11-26 NOTE — Progress Notes (Signed)
11/26/13 1130  OBSTRUCTIVE SLEEP APNEA  Have you ever been diagnosed with sleep apnea through a sleep study? (neg in past)  Do you snore loudly (loud enough to be heard through closed doors)?  0  Do you often feel tired, fatigued, or sleepy during the daytime? 0  Has anyone observed you stop breathing during your sleep? 0  Do you have, or are you being treated for high blood pressure? 1  BMI more than 35 kg/m2? 1  Age over 54 years old? 1  Neck circumference greater than 40 cm/18 inches? 0 (16.5)  Gender: 1  Obstructive Sleep Apnea Score 4  Score 4 or greater  Results sent to PCP

## 2013-11-26 NOTE — Progress Notes (Signed)
Spoke with vanessa at office to have orders released

## 2013-11-27 MED ORDER — CEFAZOLIN SODIUM-DEXTROSE 2-3 GM-% IV SOLR
2.0000 g | Freq: Once | INTRAVENOUS | Status: AC
  Administered 2013-11-28: 2 g via INTRAVENOUS
  Filled 2013-11-27: qty 50

## 2013-11-28 ENCOUNTER — Encounter (HOSPITAL_COMMUNITY): Payer: Self-pay | Admitting: *Deleted

## 2013-11-28 ENCOUNTER — Ambulatory Visit (HOSPITAL_COMMUNITY): Payer: BC Managed Care – PPO

## 2013-11-28 ENCOUNTER — Encounter (HOSPITAL_COMMUNITY)
Admission: RE | Disposition: A | Discharge status: Home / Self Care | Payer: Self-pay | Source: Ambulatory Visit | Attending: Neurosurgery

## 2013-11-28 ENCOUNTER — Encounter (HOSPITAL_COMMUNITY): Payer: BC Managed Care – PPO | Admitting: Anesthesiology

## 2013-11-28 ENCOUNTER — Inpatient Hospital Stay (HOSPITAL_COMMUNITY)
Admission: RE | Admit: 2013-11-28 | Discharge: 2013-11-29 | DRG: 520 | Disposition: A | Discharge status: Home / Self Care | Payer: BC Managed Care – PPO | Source: Ambulatory Visit | Attending: Neurosurgery | Admitting: Neurosurgery

## 2013-11-28 ENCOUNTER — Ambulatory Visit (HOSPITAL_COMMUNITY): Payer: BC Managed Care – PPO | Admitting: Anesthesiology

## 2013-11-28 DIAGNOSIS — M5126 Other intervertebral disc displacement, lumbar region: Principal | ICD-10-CM | POA: Diagnosis present

## 2013-11-28 DIAGNOSIS — Z7982 Long term (current) use of aspirin: Secondary | ICD-10-CM

## 2013-11-28 DIAGNOSIS — I10 Essential (primary) hypertension: Secondary | ICD-10-CM | POA: Diagnosis present

## 2013-11-28 DIAGNOSIS — Z981 Arthrodesis status: Secondary | ICD-10-CM

## 2013-11-28 DIAGNOSIS — F17200 Nicotine dependence, unspecified, uncomplicated: Secondary | ICD-10-CM | POA: Diagnosis present

## 2013-11-28 HISTORY — PX: LUMBAR LAMINECTOMY/DECOMPRESSION MICRODISCECTOMY: SHX5026

## 2013-11-28 SURGERY — LUMBAR LAMINECTOMY/DECOMPRESSION MICRODISCECTOMY 1 LEVEL
Anesthesia: General | Site: Back | Laterality: Right

## 2013-11-28 MED ORDER — MIDAZOLAM HCL 5 MG/5ML IJ SOLN
INTRAMUSCULAR | Status: DC | PRN
  Administered 2013-11-28: 2 mg via INTRAVENOUS

## 2013-11-28 MED ORDER — OXYCODONE-ACETAMINOPHEN 5-325 MG PO TABS
1.0000 | ORAL_TABLET | ORAL | Status: DC | PRN
  Administered 2013-11-28 – 2013-11-29 (×3): 2 via ORAL
  Filled 2013-11-28 (×3): qty 2

## 2013-11-28 MED ORDER — HYDROCODONE-ACETAMINOPHEN 5-325 MG PO TABS: 1.0000 | ORAL_TABLET | Freq: Four times a day (QID) | ORAL | Status: DC | PRN

## 2013-11-28 MED ORDER — LIDOCAINE-EPINEPHRINE 1 %-1:100000 IJ SOLN
INTRAMUSCULAR | Status: DC | PRN
  Administered 2013-11-28: 10 mL

## 2013-11-28 MED ORDER — LACTATED RINGERS IV SOLN
INTRAVENOUS | Status: DC
  Administered 2013-11-28 (×2): via INTRAVENOUS

## 2013-11-28 MED ORDER — THROMBIN 5000 UNITS EX SOLR
CUTANEOUS | Status: DC | PRN
  Administered 2013-11-28 (×2): 5000 [IU] via TOPICAL

## 2013-11-28 MED ORDER — LIDOCAINE HCL (CARDIAC) 20 MG/ML IV SOLN
INTRAVENOUS | Status: DC | PRN
  Administered 2013-11-28: 70 mg via INTRAVENOUS

## 2013-11-28 MED ORDER — HEMOSTATIC AGENTS (NO CHARGE) OPTIME
TOPICAL | Status: DC | PRN
  Administered 2013-11-28: 1 via TOPICAL

## 2013-11-28 MED ORDER — ACETAMINOPHEN 325 MG PO TABS: 650.0000 mg | ORAL_TABLET | ORAL | Status: DC | PRN

## 2013-11-28 MED ORDER — LISINOPRIL 20 MG PO TABS
20.0000 mg | ORAL_TABLET | Freq: Every day | ORAL | Status: DC
  Administered 2013-11-28: 20 mg via ORAL
  Filled 2013-11-28 (×2): qty 1

## 2013-11-28 MED ORDER — DOCUSATE SODIUM 100 MG PO CAPS
100.0000 mg | ORAL_CAPSULE | Freq: Two times a day (BID) | ORAL | Status: DC
  Administered 2013-11-28: 100 mg via ORAL
  Filled 2013-11-28 (×3): qty 1

## 2013-11-28 MED ORDER — CEFAZOLIN SODIUM 1-5 GM-% IV SOLN
1.0000 g | Freq: Three times a day (TID) | INTRAVENOUS | Status: AC
  Administered 2013-11-28 – 2013-11-29 (×2): 1 g via INTRAVENOUS
  Filled 2013-11-28 (×2): qty 50

## 2013-11-28 MED ORDER — HYDROMORPHONE HCL PF 1 MG/ML IJ SOLN
INTRAMUSCULAR | Status: AC
  Filled 2013-11-28: qty 1

## 2013-11-28 MED ORDER — ACETAMINOPHEN 650 MG RE SUPP: 650.0000 mg | RECTAL | Status: DC | PRN

## 2013-11-28 MED ORDER — HYDROMORPHONE HCL PF 1 MG/ML IJ SOLN
0.2500 mg | INTRAMUSCULAR | Status: DC | PRN
Start: 2013-11-28 — End: 2013-11-28

## 2013-11-28 MED ORDER — CYCLOBENZAPRINE HCL 10 MG PO TABS: 10.0000 mg | ORAL_TABLET | Freq: Three times a day (TID) | ORAL | Status: DC | PRN

## 2013-11-28 MED ORDER — GLYCOPYRROLATE 0.2 MG/ML IJ SOLN
INTRAMUSCULAR | Status: DC | PRN
  Administered 2013-11-28: .8 mg via INTRAVENOUS

## 2013-11-28 MED ORDER — HYDROMORPHONE HCL PF 1 MG/ML IJ SOLN
0.2500 mg | INTRAMUSCULAR | Status: DC | PRN
  Administered 2013-11-28 (×4): 0.5 mg via INTRAVENOUS

## 2013-11-28 MED ORDER — SODIUM CHLORIDE 0.9 % IJ SOLN: 3.0000 mL | INTRAMUSCULAR | Status: DC | PRN

## 2013-11-28 MED ORDER — ARTIFICIAL TEARS OP OINT
TOPICAL_OINTMENT | OPHTHALMIC | Status: DC | PRN
  Administered 2013-11-28: 1 via OPHTHALMIC

## 2013-11-28 MED ORDER — MENTHOL 3 MG MT LOZG: 1.0000 | LOZENGE | OROMUCOSAL | Status: DC | PRN

## 2013-11-28 MED ORDER — SODIUM CHLORIDE 0.9 % IJ SOLN: 3.0000 mL | Freq: Two times a day (BID) | INTRAMUSCULAR | Status: DC

## 2013-11-28 MED ORDER — NEOSTIGMINE METHYLSULFATE 1 MG/ML IJ SOLN
INTRAMUSCULAR | Status: DC | PRN
  Administered 2013-11-28: 5 mg via INTRAVENOUS

## 2013-11-28 MED ORDER — CYCLOBENZAPRINE HCL 10 MG PO TABS
10.0000 mg | ORAL_TABLET | Freq: Three times a day (TID) | ORAL | Status: DC | PRN
  Administered 2013-11-28: 10 mg via ORAL
  Filled 2013-11-28: qty 1

## 2013-11-28 MED ORDER — 0.9 % SODIUM CHLORIDE (POUR BTL) OPTIME
TOPICAL | Status: DC | PRN
  Administered 2013-11-28: 1000 mL

## 2013-11-28 MED ORDER — ASPIRIN EC 81 MG PO TBEC
81.0000 mg | DELAYED_RELEASE_TABLET | Freq: Every day | ORAL | Status: DC
  Administered 2013-11-28: 81 mg via ORAL
  Filled 2013-11-28 (×2): qty 1

## 2013-11-28 MED ORDER — ONDANSETRON HCL 4 MG/2ML IJ SOLN: 4.0000 mg | INTRAMUSCULAR | Status: DC | PRN

## 2013-11-28 MED ORDER — HYDROCHLOROTHIAZIDE 25 MG PO TABS
25.0000 mg | ORAL_TABLET | Freq: Every day | ORAL | Status: DC
  Administered 2013-11-28: 25 mg via ORAL
  Filled 2013-11-28 (×2): qty 1

## 2013-11-28 MED ORDER — ROCURONIUM BROMIDE 100 MG/10ML IV SOLN
INTRAVENOUS | Status: DC | PRN
  Administered 2013-11-28: 50 mg via INTRAVENOUS

## 2013-11-28 MED ORDER — FENTANYL CITRATE 0.05 MG/ML IJ SOLN
INTRAMUSCULAR | Status: DC | PRN
  Administered 2013-11-28: 100 ug via INTRAVENOUS
  Administered 2013-11-28 (×3): 50 ug via INTRAVENOUS

## 2013-11-28 MED ORDER — DILTIAZEM HCL ER 240 MG PO CP24
240.0000 mg | ORAL_CAPSULE | Freq: Every day | ORAL | Status: DC
  Filled 2013-11-28: qty 1

## 2013-11-28 MED ORDER — ALUM & MAG HYDROXIDE-SIMETH 200-200-20 MG/5ML PO SUSP: 30.0000 mL | Freq: Four times a day (QID) | ORAL | Status: DC | PRN

## 2013-11-28 MED ORDER — ONDANSETRON HCL 4 MG/2ML IJ SOLN
INTRAMUSCULAR | Status: DC | PRN
  Administered 2013-11-28: 4 mg via INTRAVENOUS

## 2013-11-28 MED ORDER — PHENOL 1.4 % MT LIQD: 1.0000 | OROMUCOSAL | Status: DC | PRN

## 2013-11-28 MED ORDER — SODIUM CHLORIDE 0.9 % IR SOLN
Status: DC | PRN
  Administered 2013-11-28: 13:00:00

## 2013-11-28 MED ORDER — PROPOFOL 10 MG/ML IV BOLUS
INTRAVENOUS | Status: DC | PRN
  Administered 2013-11-28: 200 mg via INTRAVENOUS

## 2013-11-28 MED ORDER — METOPROLOL SUCCINATE ER 50 MG PO TB24
50.0000 mg | ORAL_TABLET | Freq: Every day | ORAL | Status: DC
  Filled 2013-11-28 (×2): qty 1

## 2013-11-28 MED ORDER — SIMVASTATIN 5 MG PO TABS
5.0000 mg | ORAL_TABLET | Freq: Every day | ORAL | Status: DC
  Administered 2013-11-28: 5 mg via ORAL
  Filled 2013-11-28 (×2): qty 1

## 2013-11-28 MED ORDER — HYDROMORPHONE HCL PF 1 MG/ML IJ SOLN
0.5000 mg | INTRAMUSCULAR | Status: DC | PRN
  Administered 2013-11-28 (×2): 1 mg via INTRAVENOUS
  Filled 2013-11-28 (×2): qty 1

## 2013-11-28 SURGICAL SUPPLY — 56 items
BAG DECANTER FOR FLEXI CONT (MISCELLANEOUS) ×2 IMPLANT
BENZOIN TINCTURE PRP APPL 2/3 (GAUZE/BANDAGES/DRESSINGS) ×2 IMPLANT
BLADE SURG 11 STRL SS (BLADE) ×2 IMPLANT
BLADE SURG ROTATE 9660 (MISCELLANEOUS) IMPLANT
BRUSH SCRUB EZ PLAIN DRY (MISCELLANEOUS) ×2 IMPLANT
BUR MATCHSTICK NEURO 3.0 LAGG (BURR) ×2 IMPLANT
BUR PRECISION FLUTE 6.0 (BURR) ×2 IMPLANT
CANISTER SUCT 3000ML (MISCELLANEOUS) ×2 IMPLANT
CONT SPEC 4OZ CLIKSEAL STRL BL (MISCELLANEOUS) ×2 IMPLANT
DECANTER SPIKE VIAL GLASS SM (MISCELLANEOUS) ×2 IMPLANT
DERMABOND ADVANCED (GAUZE/BANDAGES/DRESSINGS) ×1
DERMABOND ADVANCED .7 DNX12 (GAUZE/BANDAGES/DRESSINGS) ×1 IMPLANT
DRAPE LAPAROTOMY 100X72X124 (DRAPES) ×2 IMPLANT
DRAPE MICROSCOPE ZEISS OPMI (DRAPES) ×2 IMPLANT
DRAPE POUCH INSTRU U-SHP 10X18 (DRAPES) ×2 IMPLANT
DRAPE PROXIMA HALF (DRAPES) IMPLANT
DRAPE SURG 17X23 STRL (DRAPES) ×2 IMPLANT
DRSG OPSITE 4X5.5 SM (GAUZE/BANDAGES/DRESSINGS) ×2 IMPLANT
DRSG OPSITE POSTOP 4X8 (GAUZE/BANDAGES/DRESSINGS) ×2 IMPLANT
DURAPREP 26ML APPLICATOR (WOUND CARE) ×2 IMPLANT
ELECT REM PT RETURN 9FT ADLT (ELECTROSURGICAL) ×2
ELECTRODE REM PT RTRN 9FT ADLT (ELECTROSURGICAL) ×1 IMPLANT
GAUZE SPONGE 4X4 16PLY XRAY LF (GAUZE/BANDAGES/DRESSINGS) IMPLANT
GLOVE BIO SURGEON STRL SZ8 (GLOVE) ×2 IMPLANT
GLOVE ECLIPSE 7.5 STRL STRAW (GLOVE) ×6 IMPLANT
GLOVE EXAM NITRILE LRG STRL (GLOVE) IMPLANT
GLOVE EXAM NITRILE MD LF STRL (GLOVE) IMPLANT
GLOVE EXAM NITRILE XL STR (GLOVE) IMPLANT
GLOVE EXAM NITRILE XS STR PU (GLOVE) IMPLANT
GLOVE INDICATOR 7.0 STRL GRN (GLOVE) ×2 IMPLANT
GLOVE INDICATOR 7.5 STRL GRN (GLOVE) ×2 IMPLANT
GLOVE INDICATOR 8.0 STRL GRN (GLOVE) ×2 IMPLANT
GLOVE INDICATOR 8.5 STRL (GLOVE) ×2 IMPLANT
GLOVE OPTIFIT SS 6.5 STRL BRWN (GLOVE) ×4 IMPLANT
GLOVE SURG SS PI 6.5 STRL IVOR (GLOVE) ×2 IMPLANT
GOWN BRE IMP SLV AUR LG STRL (GOWN DISPOSABLE) ×2 IMPLANT
GOWN BRE IMP SLV AUR XL STRL (GOWN DISPOSABLE) ×4 IMPLANT
GOWN STRL REIN 2XL LVL4 (GOWN DISPOSABLE) IMPLANT
KIT BASIN OR (CUSTOM PROCEDURE TRAY) ×2 IMPLANT
KIT ROOM TURNOVER OR (KITS) ×2 IMPLANT
NEEDLE HYPO 22GX1.5 SAFETY (NEEDLE) ×2 IMPLANT
NEEDLE SPNL 22GX3.5 QUINCKE BK (NEEDLE) ×2 IMPLANT
NS IRRIG 1000ML POUR BTL (IV SOLUTION) ×2 IMPLANT
PACK LAMINECTOMY NEURO (CUSTOM PROCEDURE TRAY) ×2 IMPLANT
RUBBERBAND STERILE (MISCELLANEOUS) ×4 IMPLANT
SPONGE GAUZE 4X4 12PLY (GAUZE/BANDAGES/DRESSINGS) ×2 IMPLANT
SPONGE SURGIFOAM ABS GEL SZ50 (HEMOSTASIS) ×2 IMPLANT
STRIP CLOSURE SKIN 1/2X4 (GAUZE/BANDAGES/DRESSINGS) ×2 IMPLANT
SUT VIC AB 0 CT1 18XCR BRD8 (SUTURE) ×1 IMPLANT
SUT VIC AB 0 CT1 8-18 (SUTURE) ×1
SUT VIC AB 2-0 CT1 18 (SUTURE) ×2 IMPLANT
SUT VICRYL 4-0 PS2 18IN ABS (SUTURE) ×2 IMPLANT
SYR 20ML ECCENTRIC (SYRINGE) ×2 IMPLANT
TOWEL OR 17X24 6PK STRL BLUE (TOWEL DISPOSABLE) ×2 IMPLANT
TOWEL OR 17X26 10 PK STRL BLUE (TOWEL DISPOSABLE) ×2 IMPLANT
WATER STERILE IRR 1000ML POUR (IV SOLUTION) ×2 IMPLANT

## 2013-11-28 NOTE — Preoperative (Signed)
Beta Blockers   Reason not to administer Beta Blockers:Toprol taken 11/28/13 at 8am.

## 2013-11-28 NOTE — H&P (Signed)
Billy Bowman is an 52 y.o. male.   Chief Complaint: Right hip and leg pain HPI: Patient is a 52 to demonstrate a progress worsening right hip and leg pain rating down the posterior aspect of his right thigh posterior calf outside bottoms right foot consistent with an S1 nerve root pattern workup revealed lateral recess stenosis and spinal stenosis at L5-S1 and the patient takes her treatment imaging findings and progression of clinical syndrome I recommended decompressive laminectomy possible discectomy at L5-S1 I extensively went over the risks and benefits of the operation the patient as well as perioperative course and expectations of outcome alternatives of surgery he understands and agrees to proceed forward.  Past Medical History  Diagnosis Date  . Hypertension   . Heart murmur     child  . Umbilical hernia     Past Surgical History  Procedure Laterality Date  . Colonoscopy  12/08/06    normal rectum/ normal colon  . Appendectomy    . Lumbar laminectomy/decompression microdiscectomy  11/27/2012    Procedure: LUMBAR LAMINECTOMY/DECOMPRESSION MICRODISCECTOMY 1 LEVEL;  Surgeon: Billy Dollar, MD;  Location: [facility];  Service: Neurosurgery;  Laterality: Right;  right lumbar two three  . Hip arthroplasty Left 5/14    History reviewed. No pertinent family history. Social History:  reports that he has been smoking Cigarettes.  He has a 10 pack-year smoking history. He does not have any smokeless tobacco history on file. He reports that he drinks alcohol. He reports that he uses illicit drugs (Marijuana).  Allergies: No Known Allergies  Medications Prior to Admission  Medication Sig Dispense Refill  . cyclobenzaprine (FLEXERIL) 10 MG tablet Take 1 tablet (10 mg total) by mouth 3 (three) times daily as needed for muscle spasms.  60 tablet  1  . diltiazem (DILACOR XR) 240 MG 24 hr capsule Take 240 mg by mouth daily.      . hydrochlorothiazide (HYDRODIURIL) 25 MG tablet Take 25 mg by  mouth daily.      Marland Kitchen HYDROcodone-acetaminophen (NORCO/VICODIN) 5-325 MG per tablet Take 1 tablet by mouth every 6 (six) hours as needed for moderate pain.      Marland Kitchen lisinopril (PRINIVIL,ZESTRIL) 20 MG tablet Take 20 mg by mouth daily.      . metoprolol succinate (TOPROL-XL) 50 MG 24 hr tablet Take 50 mg by mouth daily. Take with or immediately following a meal.      . pravastatin (PRAVACHOL) 40 MG tablet Take 80 mg by mouth daily.       Marland Kitchen aspirin 81 MG tablet Take 81 mg by mouth daily.        No results found for this or any previous visit (from the past 48 hour(s)). Dg Chest 2 View  11/28/2013   CLINICAL DATA:  Preoperative chest radiograph, smoker, hypertension, heart murmur  EXAM: CHEST  2 VIEW  COMPARISON:  11/23/2012  FINDINGS: The heart size and mediastinal contours are within normal limits. Both lungs are clear. The visualized skeletal structures are unremarkable. Lungs are hypoaerated with crowding of the bronchovascular markings. Linear left lower lobe scarring is stable.  IMPRESSION: No active cardiopulmonary disease.   Electronically Signed   By: Billy Bowman M.D.   On: 11/28/2013 11:09    Review of Systems  Constitutional: Negative.   HENT: Negative.   Eyes: Negative.   Respiratory: Negative.   Cardiovascular: Negative.   Gastrointestinal: Negative.   Musculoskeletal: Positive for back pain and myalgias.  Skin: Negative.   Neurological: Positive  for tingling and sensory change.  Endo/Heme/Allergies: Negative.   Psychiatric/Behavioral: Negative.     Blood pressure 152/85, pulse 61, temperature 98.1 F (36.7 C), resp. rate 20, SpO2 96.00%. Physical Exam  Constitutional: He is oriented to person, place, and time. He appears well-developed and well-nourished.  HENT:  Head: Normocephalic.  Eyes: Pupils are equal, round, and reactive to light.  Neck: Normal range of motion. Neck supple.  Respiratory: Effort normal and breath sounds normal.  GI: Soft. Bowel sounds are normal.   Musculoskeletal: Normal range of motion.  Neurological: He is alert and oriented to person, place, and time. He has normal strength. GCS eye subscore is 4. GCS verbal subscore is 5. GCS motor subscore is 6.  Reflex Scores:      Patellar reflexes are 0 on the right side and 0 on the left side.      Achilles reflexes are 0 on the right side and 0 on the left side. Strength is 5 out of 5 in his iliopsoas, quads, and she's, gastrocs, into tibialis, and EHL.     Assessment/Plan 52 years and presents for right-sided L5-S1 decompressive laminectomy possible microdiscectomy  Billy Bowman P 11/28/2013, 12:04 PM

## 2013-11-28 NOTE — Anesthesia Procedure Notes (Signed)
Procedure Name: Intubation Date/Time: 11/28/2013 12:30 PM Performed by: Sharlene Dory E Pre-anesthesia Checklist: Patient identified, Emergency Drugs available, Suction available, Patient being monitored and Timeout performed Patient Re-evaluated:Patient Re-evaluated prior to inductionOxygen Delivery Method: Circle system utilized Preoxygenation: Pre-oxygenation with 100% oxygen Intubation Type: IV induction Ventilation: Oral airway inserted - appropriate to patient size and Mask ventilation without difficulty Laryngoscope Size: Miller and 2 Grade View: Grade I Tube type: Oral Tube size: 8.0 mm Number of attempts: 1 Airway Equipment and Method: Stylet and Oral airway Placement Confirmation: CO2 detector,  positive ETCO2,  ETT inserted through vocal cords under direct vision and breath sounds checked- equal and bilateral Secured at: 23 cm Tube secured with: Tape Dental Injury: Teeth and Oropharynx as per pre-operative assessment

## 2013-11-28 NOTE — Plan of Care (Signed)
Problem: Consults Goal: Diagnosis - Spinal Surgery Outcome: Completed/Met Date Met:  11/28/13 Lumbar Laminectomy (Complex)

## 2013-11-28 NOTE — Op Note (Signed)
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  EBL: Minimal  History of present illness: Patient is a very pleasant 52 year old gentleman is a progress worsening back and right leg pain rate and posterior thigh posterior calf outside of his foot consistent with an S1 nerve root pattern patient refractory to all forms of conservative treatment imaging findings progression of clinical syndrome and today to surgery but I recommended laminectomy discectomy L5-S1 the right I extensively reviewed the risks and benefits of the operation the patient as well as perioperative course expectations alternatives of surgery he understood and agreed to proceed forward.  Operative procedure: Patient brought into the or was induced under general anesthesia positioned prone on the Wilson frame his back was prepped and draped in routine sterile fashion preoperative x-ray localize the appropriate level and then after infiltration 10 cc lidocaine with epi a midline incision was made and Bovie light cautery was used to take down the subcutaneous tissues and subperiosteal dissections care lamina of L5 and S1 on the right interoperative x-ray confirmed the appropriate level so a high-speed drill was used to drill down the inferior half the lamina of L5 medial facet complex superior aspect lamina of S1 laminotomy was begun with a 3 minute Kerrison punch ligament was identified in piecemeal fashion. The facet was a markedly hypertrophic the medial aspect it was under bitten to decompress the lateral aspect the canal and under microscopic illumination further abutting the circumflex allowed identification of the S1 pedicle and then inferior aspect  of the L5 nerve root was visualized and the disc spaces inspected epidermis coagulated disc space was bulging causing stenosis of the proximal S1 nerve roots or was incised with lumbar scalpel pituitary rongeurs used to remove the annulus and a minimally displaced this was then cleaned out with Epstein curettes pituitary rongeurs until it is Kitners no further stenosis on the L5 or S1 nerve root at that level. Coronary dilator easily explored each foramina as well as the thecal sac both centrally and laterally. After adequate decompression achieved with see her get meticulous in space was maintained Gelfoam was laid up the dura the muscle fascia proximal in layers with after Vicryl and the skin was closed running 4 subcuticular benzo and Steri-Strips were applied patient recovered in stable condition. At the end of case on it counts sponge counts were correct.

## 2013-11-28 NOTE — Anesthesia Preprocedure Evaluation (Addendum)
Anesthesia Evaluation  Patient identified by MRN, date of birth, ID band Patient awake    Reviewed: Allergy & Precautions, H&P , NPO status   Airway Mallampati: II      Dental  (+) Teeth Intact   Pulmonary Current Smoker,  breath sounds clear to auscultation        Cardiovascular hypertension, Rhythm:Regular Rate:Normal     Neuro/Psych    GI/Hepatic negative GI ROS, Neg liver ROS,   Endo/Other  negative endocrine ROS  Renal/GU negative Renal ROS     Musculoskeletal   Abdominal   Peds  Hematology   Anesthesia Other Findings   Reproductive/Obstetrics                          Anesthesia Physical Anesthesia Plan  ASA: III  Anesthesia Plan: General   Post-op Pain Management:    Induction: Intravenous  Airway Management Planned: Oral ETT  Additional Equipment:   Intra-op Plan:   Post-operative Plan: Possible Post-op intubation/ventilation  Informed Consent: I have reviewed the patients History and Physical, chart, labs and discussed the procedure including the risks, benefits and alternatives for the proposed anesthesia with the patient or authorized representative who has indicated his/her understanding and acceptance.   Dental advisory given  Plan Discussed with: CRNA and Anesthesiologist  Anesthesia Plan Comments:         Anesthesia Quick Evaluation

## 2013-11-29 ENCOUNTER — Encounter (HOSPITAL_COMMUNITY): Payer: Self-pay | Admitting: Neurosurgery

## 2013-11-29 MED ORDER — OXYCODONE-ACETAMINOPHEN 5-325 MG PO TABS: 1.0000 | ORAL_TABLET | ORAL | Status: DC | PRN

## 2013-11-29 MED ORDER — CYCLOBENZAPRINE HCL 10 MG PO TABS: 10.0000 mg | ORAL_TABLET | Freq: Three times a day (TID) | ORAL | Status: DC | PRN

## 2013-11-29 NOTE — Anesthesia Postprocedure Evaluation (Signed)
  Anesthesia Post-op Note  Patient: Billy Bowman  Procedure(s) Performed: Procedure(s): LUMBAR LAMINECTOMY/DECOMPRESSION MICRODISCECTOMY RIGHT LUMBAR FIVE SACRAL ONE (Right)  Patient Location: PACU  Anesthesia Type:General  Level of Consciousness: awake  Airway and Oxygen Therapy: Patient Spontanous Breathing  Post-op Pain: mild  Post-op Assessment: Post-op Vital signs reviewed  Post-op Vital Signs: Reviewed  Complications: No apparent anesthesia complications

## 2013-11-29 NOTE — Progress Notes (Signed)
Pt and wife given D/C instructions with Rx, verbal understanding was given. Pt D/C'd home via wheelchair @ 0900 per MD order. Pt stable @ D/C and had no other needs. Rema Fendt, RN

## 2013-11-29 NOTE — Progress Notes (Signed)
Subjective: Patient reports The summary no leg pain  Objective: Vital signs in last 24 hours: Temp:  [97 F (36.1 C)-99 F (37.2 C)] 99 F (37.2 C) (12/11 0335) Pulse Rate:  [54-78] 65 (12/11 0335) Resp:  [14-21] 18 (12/11 0335) BP: (115-152)/(68-89) 122/68 mmHg (12/11 0335) SpO2:  [92 %-97 %] 94 % (12/11 0335)  Intake/Output from previous day: 12/10 0701 - 12/11 0700 In: 1320 [Bowman.O.:720; I.V.:600] Out: 75 [Blood:75] Intake/Output this shift: Total I/O In: 480 [Bowman.O.:480] Out: -   Thank out of 5 wound clean and  Lab Results:  Recent Labs  11/26/13 1152  WBC 11.7*  HGB 15.0  HCT 43.2  PLT 228   BMET  Recent Labs  11/26/13 1152  NA 140  K 4.1  CL 103  CO2 24  GLUCOSE 93  BUN 20  CREATININE 1.28  CALCIUM 9.5    Studies/Results: Dg Chest 2 View  11/28/2013   CLINICAL DATA:  Preoperative chest radiograph, smoker, hypertension, heart murmur  EXAM: CHEST  2 VIEW  COMPARISON:  11/23/2012  FINDINGS: The heart size and mediastinal contours are within normal limits. Both lungs are clear. The visualized skeletal structures are unremarkable. Lungs are hypoaerated with crowding of the bronchovascular markings. Linear left lower lobe scarring is stable.  IMPRESSION: No active cardiopulmonary disease.   Electronically Signed   By: Christiana Pellant M.D.   On: 11/28/2013 11:09   Dg Lumbar Spine 2-3 Views  11/28/2013   CLINICAL DATA:  Right L5-S1 lumbar microdiscectomy.  EXAM: LUMBAR SPINE - 2-3 VIEW  COMPARISON:  MRI of the lumbar spine 09/13/2013 at [facility]  FINDINGS: A 2 intraoperative lumbar spine images are submitted. The vertebral bodies are labeled. The 1st image demonstrates a needle directed at the L5-S1 interspace. The 2nd image demonstrates soft tissue retractors and a surgical probe directed at the L5-S1 interspace.  IMPRESSION: Intraoperative localization of L5-S1.   Electronically Signed   By: Gennette Pac M.D.   On: 11/28/2013 14:35    Assessment/Plan: Discharge  home  LOS: 1 day     Billy Bowman 11/29/2013, 6:45 AM

## 2013-11-29 NOTE — Discharge Summary (Signed)
Physician Discharge Summary  Patient ID: JAYVEION STALLING MRN: [id number] DOB/AGE: Apr 14, 1961 52 y.o.  Admit date: 11/28/2013 Discharge date: 11/29/2013  Admission Diagnoses: Right S1 radiculopathy from herniated disc and spinal stenosis L5-S1 right  Discharge Diagnoses: Same Active Problems:   HNP (herniated nucleus pulposus), lumbar   Discharged Condition: good  Hospital Course: Patient admitted hospital underwent decompressive laminectomy microdiscectomy L5-S1 postop patient did very well with recovered in the floor on the floor he was convalescing well and living and voiding spontaneously tolerating her diet stable for discharge.  Consults:  Significant Diagnostic Studies:  Treatments: L5-S1 laminectomy microdiscectomy Discharge Exam: Blood pressure 122/68, pulse 65, temperature 99 F (37.2 C), temperature source Oral, resp. rate 18, SpO2 94.00%. Strength out of 5 wound clean and dry  Disposition: Home     Medication List         aspirin 81 MG tablet  Take 81 mg by mouth daily.     cyclobenzaprine 10 MG tablet  Commonly known as:  FLEXERIL  Take 1 tablet (10 mg total) by mouth 3 (three) times daily as needed for muscle spasms.     cyclobenzaprine 10 MG tablet  Commonly known as:  FLEXERIL  Take 1 tablet (10 mg total) by mouth 3 (three) times daily as needed for muscle spasms.     diltiazem 240 MG 24 hr capsule  Commonly known as:  DILACOR XR  Take 240 mg by mouth daily.     hydrochlorothiazide 25 MG tablet  Commonly known as:  HYDRODIURIL  Take 25 mg by mouth daily.     HYDROcodone-acetaminophen 5-325 MG per tablet  Commonly known as:  NORCO/VICODIN  Take 1 tablet by mouth every 6 (six) hours as needed for moderate pain.     lisinopril 20 MG tablet  Commonly known as:  PRINIVIL,ZESTRIL  Take 20 mg by mouth daily.     metoprolol succinate 50 MG 24 hr tablet  Commonly known as:  TOPROL-XL  Take 50 mg by mouth daily. Take with or immediately following a  meal.     oxyCODONE-acetaminophen 5-325 MG per tablet  Commonly known as:  PERCOCET/ROXICET  Take 1-2 tablets by mouth every 4 (four) hours as needed for moderate pain.     pravastatin 40 MG tablet  Commonly known as:  PRAVACHOL  Take 80 mg by mouth daily.           Follow-up Information   Follow up with [facility] P, MD.   Specialty:  Neurosurgery   Contact information:   [address] [place] [address] [phone]       Signed: Nedra Mcinnis P 11/29/2013, 6:48 AM

## 2013-11-29 NOTE — Transfer of Care (Signed)
Immediate Anesthesia Transfer of Care Note  Patient: Billy Bowman  Procedure(s) Performed: Procedure(s): LUMBAR LAMINECTOMY/DECOMPRESSION MICRODISCECTOMY RIGHT LUMBAR FIVE SACRAL ONE (Right)  Patient Location: PACU  Anesthesia Type:General  Level of Consciousness: awake, alert  and oriented  Airway & Oxygen Therapy: Patient Spontanous Breathing and Patient connected to nasal cannula oxygen  Post-op Assessment: Report given to PACU RN, Post -op Vital signs reviewed and stable and Patient moving all extremities X 4  Post vital signs: Reviewed and stable  Complications: No apparent anesthesia complications

## 2019-07-27 ENCOUNTER — Other Ambulatory Visit: Payer: Self-pay

## 2019-07-27 DIAGNOSIS — Z20822 Contact with and (suspected) exposure to covid-19: Secondary | ICD-10-CM

## 2019-07-28 LAB — NOVEL CORONAVIRUS, NAA: SARS-CoV-2, NAA: NOT DETECTED

## 2021-07-21 ENCOUNTER — Encounter: Payer: Self-pay | Admitting: Gastroenterology

## 2021-09-15 ENCOUNTER — Other Ambulatory Visit: Payer: Self-pay

## 2021-09-15 ENCOUNTER — Ambulatory Visit (AMBULATORY_SURGERY_CENTER): Payer: Medicare Other

## 2021-09-15 VITALS — Ht 67.0 in | Wt 200.0 lb

## 2021-09-15 DIAGNOSIS — Z1211 Encounter for screening for malignant neoplasm of colon: Secondary | ICD-10-CM

## 2021-09-15 MED ORDER — PLENVU 140 G PO SOLR: 1.0000 | ORAL | 0 refills | Status: DC

## 2021-09-15 NOTE — Progress Notes (Signed)

## 2021-09-21 ENCOUNTER — Encounter: Payer: Self-pay | Admitting: Gastroenterology

## 2021-09-29 ENCOUNTER — Encounter: Payer: Self-pay | Admitting: Gastroenterology

## 2021-09-29 ENCOUNTER — Other Ambulatory Visit: Payer: Self-pay

## 2021-09-29 ENCOUNTER — Ambulatory Visit (AMBULATORY_SURGERY_CENTER): Payer: Medicare Other | Admitting: Gastroenterology

## 2021-09-29 VITALS — BP 157/85 | HR 53 | Temp 97.3°F | Resp 12 | Ht 67.0 in | Wt 200.0 lb

## 2021-09-29 DIAGNOSIS — Z1211 Encounter for screening for malignant neoplasm of colon: Secondary | ICD-10-CM

## 2021-09-29 DIAGNOSIS — Z800 Family history of malignant neoplasm of digestive organs: Secondary | ICD-10-CM

## 2021-09-29 DIAGNOSIS — D127 Benign neoplasm of rectosigmoid junction: Secondary | ICD-10-CM

## 2021-09-29 DIAGNOSIS — D123 Benign neoplasm of transverse colon: Secondary | ICD-10-CM

## 2021-09-29 DIAGNOSIS — Z86010 Personal history of colonic polyps: Secondary | ICD-10-CM

## 2021-09-29 MED ORDER — SODIUM CHLORIDE 0.9 % IV SOLN: 500.0000 mL | Freq: Once | INTRAVENOUS | Status: DC

## 2021-09-29 NOTE — Progress Notes (Signed)
Called to room to assist during endoscopic procedure.  Patient ID and intended procedure confirmed with present staff. Received instructions for my participation in the procedure from the performing physician.  

## 2021-09-29 NOTE — Progress Notes (Signed)
A/ox3, pleased with MAC, report to RN 

## 2021-09-29 NOTE — Patient Instructions (Signed)
Handout given for polyps.  YOU HAD AN ENDOSCOPIC PROCEDURE TODAY AT THE [facility]:   Refer to the procedure report that was given to you for any specific questions about what was found during the examination.  If the procedure report does not answer your questions, please call your gastroenterologist to clarify.  If you requested that your care partner not be given the details of your procedure findings, then the procedure report has been included in a sealed envelope for you to review at your convenience later.  YOU SHOULD EXPECT: Some feelings of bloating in the abdomen. Passage of more gas than usual.  Walking can help get rid of the air that was put into your GI tract during the procedure and reduce the bloating. If you had a lower endoscopy (such as a colonoscopy or flexible sigmoidoscopy) you may notice spotting of blood in your stool or on the toilet paper. If you underwent a bowel prep for your procedure, you may not have a normal bowel movement for a few days.  Please Note:  You might notice some irritation and congestion in your nose or some drainage.  This is from the oxygen used during your procedure.  There is no need for concern and it should clear up in a day or so.  SYMPTOMS TO REPORT IMMEDIATELY:   Following lower endoscopy (colonoscopy or flexible sigmoidoscopy):  Excessive amounts of blood in the stool  Significant tenderness or worsening of abdominal pains  Swelling of the abdomen that is new, acute  Fever of 100F or higher  For urgent or emergent issues, a gastroenterologist can be reached at any hour by calling [phone]. Do not use MyChart messaging for urgent concerns.    DIET:  We do recommend a small meal at first, but then you may proceed to your regular diet.  Drink plenty of fluids but you should avoid alcoholic beverages for 24 hours.  ACTIVITY:  You should plan to take it easy for the rest of today and you should NOT DRIVE or use heavy  machinery until tomorrow (because of the sedation medicines used during the test).    FOLLOW UP: Our staff will call the number listed on your records 48-72 hours following your procedure to check on you and address any questions or concerns that you may have regarding the information given to you following your procedure. If we do not reach you, we will leave a message.  We will attempt to reach you two times.  During this call, we will ask if you have developed any symptoms of COVID 19. If you develop any symptoms (ie: fever, flu-like symptoms, shortness of breath, cough etc.) before then, please call [phone].  If you test positive for Covid 19 in the 2 weeks post procedure, please call and report this information to us.    If any biopsies were taken you will be contacted by phone or by letter within the next 1-3 weeks.  Please call us at [phone] if you have not heard about the biopsies in 3 weeks.    SIGNATURES/CONFIDENTIALITY: You and/or your care partner have signed paperwork which will be entered into your electronic medical record.  These signatures attest to the fact that that the information above on your After Visit Summary has been reviewed and is understood.  Full responsibility of the confidentiality of this discharge information lies with you and/or your care-partner. 

## 2021-09-29 NOTE — Progress Notes (Signed)
[facility] History and Physical   Primary Care Physician:  Lemmie Evens, MD   Reason for Procedure:   Colon cancer screening  Plan:    Screening colonoscopy     HPI: Billy Bowman is a 60 y.o. male undergoing screening colonoscopy.  His brother and sister were both diagnosed with colon cancer.  He has no chronic GI symptoms.  His last colonoscopy was in 2007 and he thinks a polyp was removed.   Past Medical History:  Diagnosis Date   Arthritis    Heart murmur    child   Hyperlipidemia    Hypertension    Umbilical hernia     Past Surgical History:  Procedure Laterality Date   APPENDECTOMY     COLONOSCOPY  12/08/06   normal rectum/ normal colon   HIP ARTHROPLASTY Left 5/14   LUMBAR LAMINECTOMY/DECOMPRESSION MICRODISCECTOMY  11/27/2012   Procedure: LUMBAR LAMINECTOMY/DECOMPRESSION MICRODISCECTOMY 1 LEVEL;  Surgeon: Elaina Hoops, MD;  Location: [facility];  Service: Neurosurgery;  Laterality: Right;  right lumbar two three   LUMBAR LAMINECTOMY/DECOMPRESSION MICRODISCECTOMY Right 11/28/2013   Procedure: LUMBAR LAMINECTOMY/DECOMPRESSION MICRODISCECTOMY RIGHT LUMBAR FIVE SACRAL ONE;  Surgeon: Elaina Hoops, MD;  Location: [facility];  Service: Neurosurgery;  Laterality: Right;    Prior to Admission medications   Medication Sig Start Date End Date Taking? Authorizing Provider  aspirin 81 MG tablet Take 81 mg by mouth daily.   Yes [provider]  atorvastatin (LIPITOR) 40 MG tablet Take 40 mg by mouth daily. 07/06/21  Yes [provider]  hydrochlorothiazide (HYDRODIURIL) 25 MG tablet Take 25 mg by mouth daily.   Yes [provider]  lisinopril (PRINIVIL,ZESTRIL) 20 MG tablet Take 20 mg by mouth daily.   Yes [provider]  pravastatin (PRAVACHOL) 40 MG tablet Take 80 mg by mouth daily.   Yes [provider]  tamsulosin (FLOMAX) 0.4 MG CAPS capsule Take 0.4 mg by mouth 2 (two) times daily. 08/28/21  Yes [provider]  cyclobenzaprine (FLEXERIL) 10 MG tablet Take 1 tablet (10 mg total) by mouth 3 (three) times daily as needed for muscle spasms. Patient not taking: No sig reported 11/28/12   Kary Kos, MD  cyclobenzaprine (FLEXERIL) 10 MG tablet Take 1 tablet (10 mg total) by mouth 3 (three) times daily as needed for muscle spasms. Patient not taking: No sig reported 11/29/13   Kary Kos, MD  diltiazem (DILACOR XR) 240 MG 24 hr capsule Take 240 mg by mouth daily. Patient not taking: No sig reported    [provider]  HYDROcodone-acetaminophen (NORCO/VICODIN) 5-325 MG per tablet Take 1 tablet by mouth every 6 (six) hours as needed for moderate pain. Patient not taking: Reported on 09/29/2021    [provider]  metoprolol succinate (TOPROL-XL) 50 MG 24 hr tablet Take 50 mg by mouth daily. Take with or immediately following a meal. Patient not taking: No sig reported    [provider]  oxyCODONE-acetaminophen (PERCOCET/ROXICET) 5-325 MG per tablet Take 1-2 tablets by mouth every 4 (four) hours as needed for moderate pain. Patient not taking: No sig reported 11/29/13   Kary Kos, MD    Current Outpatient Medications  Medication Sig Dispense Refill   aspirin 81 MG tablet Take 81 mg by mouth daily.     atorvastatin (LIPITOR) 40 MG tablet Take 40 mg by mouth daily.     hydrochlorothiazide (HYDRODIURIL) 25 MG tablet Take 25 mg by mouth daily.  lisinopril (PRINIVIL,ZESTRIL) 20 MG tablet Take 20 mg by mouth daily.     pravastatin (PRAVACHOL) 40 MG tablet Take 80 mg by mouth daily.     tamsulosin (FLOMAX) 0.4 MG CAPS capsule Take 0.4 mg by mouth 2 (two) times daily.     cyclobenzaprine (FLEXERIL) 10 MG tablet Take 1 tablet (10 mg total) by mouth 3 (three) times daily as needed for muscle spasms. (Patient not taking: No sig reported) 60 tablet 1   cyclobenzaprine (FLEXERIL) 10 MG tablet Take 1 tablet (10 mg total) by mouth 3 (three) times daily as needed for muscle  spasms. (Patient not taking: No sig reported) 80 tablet 0   diltiazem (DILACOR XR) 240 MG 24 hr capsule Take 240 mg by mouth daily. (Patient not taking: No sig reported)     HYDROcodone-acetaminophen (NORCO/VICODIN) 5-325 MG per tablet Take 1 tablet by mouth every 6 (six) hours as needed for moderate pain. (Patient not taking: Reported on 09/29/2021)     metoprolol succinate (TOPROL-XL) 50 MG 24 hr tablet Take 50 mg by mouth daily. Take with or immediately following a meal. (Patient not taking: No sig reported)     oxyCODONE-acetaminophen (PERCOCET/ROXICET) 5-325 MG per tablet Take 1-2 tablets by mouth every 4 (four) hours as needed for moderate pain. (Patient not taking: No sig reported) 80 tablet 0   Current Facility-Administered Medications  Medication Dose Route Frequency Provider Last Rate Last Admin   0.9 %  sodium chloride infusion  500 mL Intravenous Once Daryel November, MD        Allergies as of 09/29/2021 - Review Complete 09/29/2021  Allergen Reaction Noted   Hydrochlorothiazide Swelling 09/15/2021    Family History  Problem Relation Age of Onset   Colon cancer Sister    Colon cancer Brother 80   Colon polyps Neg Hx    Esophageal cancer Neg Hx    Rectal cancer Neg Hx    Stomach cancer Neg Hx     Social History   Socioeconomic History   Marital status: Married    Spouse name: Not on file   Number of children: Not on file   Years of education: Not on file   Highest education level: Not on file  Occupational History   Not on file  Tobacco Use   Smoking status: Former    Packs/day: 0.50    Years: 20.00    Pack years: 10.00    Types: Cigarettes   Smokeless tobacco: Never   Tobacco comments:    no maijuna last 3 months  Vaping Use   Vaping Use: Never used  Substance and Sexual Activity   Alcohol use: Yes    Comment: occasionally   Drug use: Yes    Types: Marijuana    Comment: q 2-3 wks   Sexual activity: Not on file  Other Topics Concern   Not on file   Social History Narrative   Not on file   Social Determinants of Health   Financial Resource Strain: Not on file  Food Insecurity: Not on file  Transportation Needs: Not on file  Physical Activity: Not on file  Stress: Not on file  Social Connections: Not on file  Intimate Partner Violence: Not on file    Review of Systems: All other review of systems negative except as mentioned in the HPI.  Physical Exam: Vital signs BP (!) 189/98   Pulse (!) 56   Temp (!) 97.3 F (36.3 C) (Skin)   Ht 5\' 7"  (1.702  m)   Wt 200 lb (90.7 kg)   SpO2 100%   BMI 31.32 kg/m   General:   Alert,  Well-developed, well-nourished, pleasant and cooperative in NAD Lungs:  Clear throughout to auscultation.   Heart:  Regular rate and rhythm; no murmurs, clicks, rubs,  or gallops. Abdomen:  Soft, nontender and nondistended. Normal bowel sounds.   Neuro/Psych:  Normal mood and affect. A and O x 3   Melquisedec Journey E. Candis Schatz, MD [facility]

## 2021-09-29 NOTE — Op Note (Signed)
[facility] Patient Name: Billy Bowman Procedure Date: 09/29/2021 11:23 AM MRN: [id number] Endoscopist: Nicki Reaper E. Candis Schatz , MD Age: 60 Referring MD:  Date of Birth: 1961-08-23 Gender: Male Account #: [id number] Procedure:                Colonoscopy Indications:              Screening in patient at increased risk: Colorectal                            cancer in brother before age 72, Screening in                            patient at increased risk: Colorectal cancer in                            sister before age 64, High risk colon cancer                            surveillance: Personal history of colonic polyps Medicines:                Monitored Anesthesia Care Procedure:                Pre-Anesthesia Assessment:                           - Prior to the procedure, a History and Physical                            was performed, and patient medications and                            allergies were reviewed. The patient's tolerance of                            previous anesthesia was also reviewed. The risks                            and benefits of the procedure and the sedation                            options and risks were discussed with the patient.                            All questions were answered, and informed consent                            was obtained. Prior Anticoagulants: The patient has                            taken no previous anticoagulant or antiplatelet                            agents. ASA Grade Assessment: II - A patient with  mild systemic disease. After reviewing the risks                            and benefits, the patient was deemed in                            satisfactory condition to undergo the procedure.                           After obtaining informed consent, the colonoscope                            was passed under direct vision. Throughout the                            procedure, the  patient's blood pressure, pulse, and                            oxygen saturations were monitored continuously. The                            CF [id number] #[id number] was introduced through the anus                            and advanced to the the cecum, identified by                            appendiceal orifice and ileocecal valve. The                            colonoscopy was performed without difficulty. The                            patient tolerated the procedure well. The quality                            of the bowel preparation was good. The ileocecal                            valve, appendiceal orifice, and rectum were                            photographed. Scope In: 11:35:48 AM Scope Out: 11:59:16 AM Scope Withdrawal Time: 0 hours 18 minutes 38 seconds  Total Procedure Duration: 0 hours 23 minutes 28 seconds  Findings:                 The perianal and digital rectal examinations were                            normal. Pertinent negatives include normal                            sphincter tone and no palpable rectal lesions.  Two sessile polyps were found in the transverse                            colon. The polyps were 5 to 7 mm in size. These                            polyps were removed with a cold snare. Resection                            and retrieval were complete. Estimated blood loss                            was minimal.                           A 6 mm polyp was found in the transverse colon. The                            polyp was sessile. The polyp was removed with a                            cold snare. Resection and retrieval were complete.                            Estimated blood loss was minimal.                           A 4 mm polyp was found in the splenic flexure. The                            polyp was sessile. The polyp was removed with a                            cold snare. Resection and retrieval were complete.                             Estimated blood loss was minimal.                           A 10 mm polyp was found in the recto-sigmoid colon.                            The polyp was semi-pedunculated. The polyp was                            removed with a cold snare. Resection and retrieval                            were complete. Estimated blood loss was minimal.                           The exam was otherwise normal throughout the  examined colon.                           The retroflexed view of the distal rectum and anal                            verge was normal and showed no anal or rectal                            abnormalities. Complications:            No immediate complications. Estimated Blood Loss:     Estimated blood loss was minimal. Impression:               - Two 5 to 7 mm polyps in the transverse colon,                            removed with a cold snare. Resected and retrieved.                           - One 6 mm polyp in the transverse colon, removed                            with a cold snare. Resected and retrieved.                           - One 4 mm polyp at the splenic flexure, removed                            with a cold snare. Resected and retrieved.                           - One 10 mm polyp at the recto-sigmoid colon,                            removed with a cold snare. Resected and retrieved.                           - The distal rectum and anal verge are normal on                            retroflexion view. Recommendation:           - Patient has a contact number available for                            emergencies. The signs and symptoms of potential                            delayed complications were discussed with the                            patient. Return to normal activities tomorrow.  Written discharge instructions were provided to the                            patient.                            - Resume previous diet.                           - Continue present medications.                           - Await pathology results.                           - Repeat colonoscopy in 3 years for surveillance. Toryn Dewalt E. Candis Schatz, MD 09/29/2021 12:05:49 PM This report has been signed electronically.

## 2021-09-29 NOTE — Progress Notes (Signed)
Pt's states no medical or surgical changes since previsit or office visit. VS assessed by D.T 

## 2021-10-01 ENCOUNTER — Telehealth: Payer: Self-pay

## 2021-10-01 NOTE — Telephone Encounter (Signed)
  Follow up Call-  Call back number 09/29/2021  Post procedure Call Back phone  # [phone]  Permission to leave phone message Yes  Some recent data might be hidden     Patient questions:  Do you have a fever, pain , or abdominal swelling? No. Pain Score  0 *  Have you tolerated food without any problems? Yes.    Have you been able to return to your normal activities? Yes.    Do you have any questions about your discharge instructions: Diet   No. Medications  No. Follow up visit  No.  Do you have questions or concerns about your Care? No.  Actions: * If pain score is 4 or above: No action needed, pain <4.  Have you developed a fever since your procedure? no  2.   Have you had an respiratory symptoms (SOB or cough) since your procedure? no  3.   Have you tested positive for COVID 19 since your procedure no  4.   Have you had any family members/close contacts diagnosed with the COVID 19 since your procedure?  no   If yes to any of these questions please route to Joylene John, RN and Joella Prince, RN

## 2021-10-07 ENCOUNTER — Encounter: Payer: Self-pay | Admitting: Gastroenterology

## 2022-01-08 ENCOUNTER — Encounter (HOSPITAL_COMMUNITY): Payer: Self-pay | Admitting: *Deleted

## 2022-01-08 ENCOUNTER — Emergency Department (HOSPITAL_COMMUNITY): Payer: Medicare Other

## 2022-01-08 ENCOUNTER — Emergency Department (HOSPITAL_COMMUNITY)
Admission: EM | Admit: 2022-01-08 | Discharge: 2022-01-08 | Disposition: A | Discharge status: Home / Self Care | Payer: Medicare Other | Attending: Emergency Medicine | Admitting: Emergency Medicine

## 2022-01-08 DIAGNOSIS — M4800 Spinal stenosis, site unspecified: Secondary | ICD-10-CM

## 2022-01-08 DIAGNOSIS — M48061 Spinal stenosis, lumbar region without neurogenic claudication: Secondary | ICD-10-CM | POA: Insufficient documentation

## 2022-01-08 DIAGNOSIS — M545 Low back pain, unspecified: Secondary | ICD-10-CM | POA: Diagnosis present

## 2022-01-08 DIAGNOSIS — Z7982 Long term (current) use of aspirin: Secondary | ICD-10-CM | POA: Insufficient documentation

## 2022-01-08 DIAGNOSIS — I10 Essential (primary) hypertension: Secondary | ICD-10-CM | POA: Insufficient documentation

## 2022-01-08 DIAGNOSIS — Z79899 Other long term (current) drug therapy: Secondary | ICD-10-CM | POA: Diagnosis not present

## 2022-01-08 DIAGNOSIS — M5412 Radiculopathy, cervical region: Secondary | ICD-10-CM | POA: Diagnosis not present

## 2022-01-08 DIAGNOSIS — I2510 Atherosclerotic heart disease of native coronary artery without angina pectoris: Secondary | ICD-10-CM | POA: Diagnosis not present

## 2022-01-08 LAB — CBC
HCT: 42 % (ref 39.0–52.0)
Hemoglobin: 13.8 g/dL (ref 13.0–17.0)
MCH: 29.7 pg (ref 26.0–34.0)
MCHC: 32.9 g/dL (ref 30.0–36.0)
MCV: 90.3 fL (ref 80.0–100.0)
Platelets: 173 10*3/uL (ref 150–400)
RBC: 4.65 MIL/uL (ref 4.22–5.81)
RDW: 13.5 % (ref 11.5–15.5)
WBC: 8.3 10*3/uL (ref 4.0–10.5)
nRBC: 0 % (ref 0.0–0.2)

## 2022-01-08 LAB — BASIC METABOLIC PANEL
Anion gap: 7 (ref 5–15)
BUN: 14 mg/dL (ref 8–23)
CO2: 24 mmol/L (ref 22–32)
Calcium: 9.2 mg/dL (ref 8.9–10.3)
Chloride: 106 mmol/L (ref 98–111)
Creatinine, Ser: 1.07 mg/dL (ref 0.61–1.24)
GFR, Estimated: >60 mL/min (ref >60)
Glucose, Bld: 92 mg/dL (ref 70–99)
Potassium: 3.7 mmol/L (ref 3.5–5.1)
Sodium: 137 mmol/L (ref 135–145)

## 2022-01-08 LAB — TROPONIN I (HIGH SENSITIVITY)
Troponin I (High Sensitivity): 22 ng/L — ABNORMAL HIGH (ref <18)
Troponin I (High Sensitivity): 26 ng/L — ABNORMAL HIGH (ref <18)

## 2022-01-08 MED ORDER — NAPROXEN 500 MG PO TABS: 500.0000 mg | ORAL_TABLET | Freq: Two times a day (BID) | ORAL | 0 refills | Status: DC

## 2022-01-08 MED ORDER — DEXAMETHASONE SODIUM PHOSPHATE 10 MG/ML IJ SOLN
10.0000 mg | Freq: Once | INTRAMUSCULAR | Status: AC
  Administered 2022-01-08: 10 mg via INTRAMUSCULAR
  Filled 2022-01-08: qty 1

## 2022-01-08 NOTE — ED Notes (Signed)
Pt d/c home with spouse per MD order. Discharge summary reviewed. Pt verbalizes understanding. Ambulatory off unit. No s/s of acute distress noted.

## 2022-01-08 NOTE — ED Notes (Signed)
Patient transported to MRI 

## 2022-01-08 NOTE — Discharge Instructions (Addendum)
We have performed an MRI of your spine which shows that you have multiple different areas where there are pinched nerves and some narrowing around your spinal cord.  We have given you a steroid which should help to reduce some of the swelling but you will need to be seen by the surgeon in the office.  I have referred you to Dr. Christella Noa, I spoke with him on the phone and he wants to see within the office within the next 1 to 2 weeks.  Please return to the emergency department for any severe or worsening symptoms especially any severe pain or weakness.  If that occurs go straight to the [facility] emergency department where the surgeons are, you may come back here if you need to and you cannot get down to that  Please go back to taking your full dose blood pressure medicines

## 2022-01-08 NOTE — ED Triage Notes (Signed)
Left shoulder pain with elevated blood pressure

## 2022-01-08 NOTE — ED Provider Notes (Signed)
[facility] Provider Note   CSN: [id number] Arrival date & time: 01/08/22  1112     History  Chief Complaint  Patient presents with   Shoulder Pain    Billy Bowman is a 61 y.o. male.   Shoulder Pain  This patient is a 61 year old male, he has a history of hypertension on lisinopril and hydrochlorothiazide, he has a history of high cholesterol on a statin and presents to the hospital today with a complaint of approximately 5 days of pain in the left side of his neck.  He states that this pain has actually been intermittent for several months however over the last 5 days it has been rather consistent and gradually worsening, it has been present all day today and is located in the left trapezius and radiating into the left shoulder itself.  Associated with this this week he is also had numbness of the thumb second and third fingers of the left hand as well as half of the fourth finger of the left hand.  He denies any weakness of that hand.  He has not been seen by his family doctor but when they called the office today they recommended they come to the ER.  He has no prior history of coronary disease.  He took his blood pressure today and noted that it was severely elevated, of note this patient does not have severely elevated blood pressure at rest in fact he is cut his medicine in half recently because of having some low blood pressure intermittent  Home Medications Prior to Admission medications   Medication Sig Start Date End Date Taking? Authorizing Provider  naproxen (NAPROSYN) 500 MG tablet Take 1 tablet (500 mg total) by mouth 2 (two) times daily with a meal. 01/08/22  Yes Noemi Chapel, MD  aspirin 81 MG tablet Take 81 mg by mouth daily.    [provider]  atorvastatin (LIPITOR) 40 MG tablet Take 40 mg by mouth daily. 07/06/21   [provider]  cyclobenzaprine (FLEXERIL) 10 MG tablet Take 1 tablet (10 mg total) by mouth 3 (three) times daily as  needed for muscle spasms. Patient not taking: No sig reported 11/28/12   Kary Kos, MD  cyclobenzaprine (FLEXERIL) 10 MG tablet Take 1 tablet (10 mg total) by mouth 3 (three) times daily as needed for muscle spasms. Patient not taking: No sig reported 11/29/13   Kary Kos, MD  diltiazem (DILACOR XR) 240 MG 24 hr capsule Take 240 mg by mouth daily. Patient not taking: No sig reported    [provider]  hydrochlorothiazide (HYDRODIURIL) 25 MG tablet Take 25 mg by mouth daily.    [provider]  HYDROcodone-acetaminophen (NORCO/VICODIN) 5-325 MG per tablet Take 1 tablet by mouth every 6 (six) hours as needed for moderate pain. Patient not taking: Reported on 09/29/2021    [provider]  lisinopril (PRINIVIL,ZESTRIL) 20 MG tablet Take 20 mg by mouth daily.    [provider]  metoprolol succinate (TOPROL-XL) 50 MG 24 hr tablet Take 50 mg by mouth daily. Take with or immediately following a meal. Patient not taking: No sig reported    [provider]  oxyCODONE-acetaminophen (PERCOCET/ROXICET) 5-325 MG per tablet Take 1-2 tablets by mouth every 4 (four) hours as needed for moderate pain. Patient not taking: No sig reported 11/29/13   Kary Kos, MD  pravastatin (PRAVACHOL) 40 MG tablet Take 80 mg by mouth daily.    [provider]  tamsulosin [facility])  0.4 MG CAPS capsule Take 0.4 mg by mouth 2 (two) times daily. 08/28/21   [provider]      Allergies    Hydrochlorothiazide    Review of Systems   Review of Systems  All other systems reviewed and are negative.  Physical Exam Updated Vital Signs BP (!) 186/92    Pulse (!) 54    Temp 98 F (36.7 C) (Oral)    Resp 16    SpO2 98%  Physical Exam Vitals and nursing note reviewed.  Constitutional:      General: He is not in acute distress.    Appearance: He is well-developed.  HENT:     Head: Normocephalic and atraumatic.     Mouth/Throat:     Pharynx: No oropharyngeal  exudate.  Eyes:     General: No scleral icterus.       Right eye: No discharge.        Left eye: No discharge.     Conjunctiva/sclera: Conjunctivae normal.     Pupils: Pupils are equal, round, and reactive to light.  Neck:     Thyroid: No thyromegaly.     Vascular: No JVD.  Cardiovascular:     Rate and Rhythm: Normal rate and regular rhythm.     Heart sounds: Normal heart sounds. No murmur heard.   No friction rub. No gallop.  Pulmonary:     Effort: Pulmonary effort is normal. No respiratory distress.     Breath sounds: Normal breath sounds. No wheezing or rales.  Abdominal:     General: Bowel sounds are normal. There is no distension.     Palpations: Abdomen is soft. There is no mass.     Tenderness: There is no abdominal tenderness.  Musculoskeletal:        General: No tenderness. Normal range of motion.     Cervical back: Normal range of motion and neck supple.     Comments: Reproducible tenderness to palpation of the left trapezius  Lymphadenopathy:     Cervical: No cervical adenopathy.  Skin:    General: Skin is warm and dry.     Findings: No erythema or rash.  Neurological:     Mental Status: He is alert.     Coordination: Coordination normal.     Comments: The patient does not have any increasing numbness pain or weakness with a cervical load test, he has totally normal strength in all 4 extremities including the left upper extremity with regarding the left shoulder girdle, bicep, tricep, forearm, grip as well as the intrinsic muscles of the hand.  He has a decree sensation to the left hand over the thumb second and third fingers.  Psychiatric:        Behavior: Behavior normal.    ED Results / Procedures / Treatments   Labs (all labs ordered are listed, but only abnormal results are displayed) Labs Reviewed  TROPONIN I (HIGH SENSITIVITY) - Abnormal; Notable for the following components:      Result Value   Troponin I (High Sensitivity) 22 (*)    All other  components within normal limits  TROPONIN I (HIGH SENSITIVITY) - Abnormal; Notable for the following components:   Troponin I (High Sensitivity) 26 (*)    All other components within normal limits  BASIC METABOLIC PANEL  CBC    EKG EKG Interpretation  Date/Time:  Friday January 08 2022 11:54:07 EST Ventricular Rate:  70 PR Interval:  162 QRS Duration: 86 QT Interval:  422 QTC  Calculation: 455 R Axis:   56 Text Interpretation: Normal sinus rhythm Biatrial enlargement Abnormal ECG When compared with ECG of 26-Nov-2013 12:47, No significant change was found Confirmed by Noemi Chapel [phone]) on 01/08/2022 3:41:15 PM  Radiology DG Chest 2 View  Result Date: 01/08/2022 CLINICAL DATA:  Left shoulder pain since Monday. Elevated blood pressure. EXAM: CHEST - 2 VIEW COMPARISON:  11/28/2013 FINDINGS: Midline trachea. Normal heart size. Atherosclerosis in the transverse aorta. No pleural effusion or pneumothorax. Clear lungs. IMPRESSION: No active cardiopulmonary disease. Electronically Signed   By: Abigail Miyamoto M.D.   On: 01/08/2022 12:30   MR Cervical Spine Wo Contrast  Result Date: 01/08/2022 CLINICAL DATA:  Cervical radiculopathy, left shoulder pain EXAM: MRI CERVICAL SPINE WITHOUT CONTRAST TECHNIQUE: Multiplanar, multisequence MR imaging of the cervical spine was performed. No intravenous contrast was administered. COMPARISON:  None. FINDINGS: Alignment: Straightening and reversal of the normal cervical lordosis. Vertebrae: No acute fracture or suspicious osseous lesion. Cord: Normal signal and morphology. Posterior Fossa, vertebral arteries, paraspinal tissues: Negative. Disc levels: C2-C3: No significant disc bulge. No spinal canal stenosis. Mild facet arthropathy. No neural foraminal narrowing. C3-C4: No significant disc bulge. Uncovertebral and facet arthropathy. No spinal canal stenosis. Mild left and moderate to severe right neural foraminal narrowing. C4-C5: Mild disc bulge. Uncovertebral  and facet arthropathy. No spinal canal stenosis. Moderate to severe left and severe right neural foraminal narrowing. C5-C6: Mild disc bulge with superimposed central protrusion. Moderate to severe spinal canal stenosis. Uncovertebral and facet arthropathy. Severe right and moderate left neural foraminal narrowing. C6-C7: Mild disc bulge. Mild-to-moderate spinal canal stenosis. Uncovertebral and facet arthropathy. Severe left greater than right neural foraminal narrowing. C7-T1: No significant disc bulge uncovertebral and facet arthropathy, right-greater-than-left. Moderate right neural foraminal narrowing. No spinal canal stenosis. IMPRESSION: 1. C5-C6 moderate to severe spinal canal stenosis with severe right and moderate left neural foraminal narrowing. 2. C6-C7 mild-to-moderate spinal canal stenosis with severe left-greater-than-right neural foraminal narrowing. 3. C4-C5 severe right and moderate to severe left neural foraminal narrowing. 4. C3-C4 moderate to severe right and mild left neural foraminal narrowing. 5. C7-T1 moderate right neural foraminal narrowing. Electronically Signed   By: Merilyn Baba M.D.   On: 01/08/2022 17:03   DG Shoulder Left  Result Date: 01/08/2022 CLINICAL DATA:  Left shoulder pain.  No injury EXAM: LEFT SHOULDER - 2+ VIEW COMPARISON:  None. FINDINGS: There is no evidence of fracture or dislocation. There is no evidence of arthropathy or other focal bone abnormality. Soft tissues are unremarkable. IMPRESSION: Negative. Electronically Signed   By: Franchot Gallo M.D.   On: 01/08/2022 12:30    Procedures Procedures    Medications Ordered in ED Medications  dexamethasone (DECADRON) injection 10 mg (10 mg Intramuscular Given 01/08/22 1606)    ED Course/ Medical Decision Making/ A&P                           Medical Decision Making I have reviewed the work-up that was initiated by the nurse at triage, the patient has no chest pain, he had 2 troponins drawn and there was  no significant increase, baseline of 26.  Metabolic panel is reassuring without renal dysfunction, there is no leukocytosis.  I am far more concerned that the patient may have a cervical radiculopathy and an MRI will be ordered.  The patient is agreeable to the plan, Decadron given, blood pressure will be rechecked, he is not tachycardic or febrile, his EKG  is unremarkable.  Problems Addressed: Cervical radicular pain: acute illness or injury    Details: Discussed the care with the neurosurgeon Dr. Christella Noa who recommends outpatient follow-up and Decadron Primary hypertension: acute illness or injury    Details: The patient's blood pressure trended downwards as his anxiety released Spinal stenosis, unspecified spinal region: acute illness or injury    Details: Patient informed of his results and follow-up with neurosurgery given, patient agrees to follow-up  Amount and/or Complexity of Data Reviewed External Data Reviewed: radiology and notes.    Details: I have reviewed the notes including surgical reports and discharge summaries from approximately 9 years ago when the patient had lumbar surgery Labs: ordered.    Details: The patient has had a troponin with no significant delta change, he is not having any chest pain, other metabolic panel and CBC are unremarkable, this is my personal interpretation Radiology: ordered and independent interpretation performed.    Details: MRI with multiple areas of spinal stenosis and radicular stenosis ECG/medicine tests: ordered and independent interpretation performed. Decision-making details documented in ED Course. Discussion of management or test interpretation with external provider(s): Patient is well-appearing, he agrees to follow-up with neurosurgery, given Decadron prior to discharge, considered hospitalization and transfer but according to neurosurgery and according to my evaluation of the patient I do not think that this is a necessary emergent  transfer.  The patient has been given the instructions for return as well as the signs and symptoms of worsening symptoms and disease.  He is agreeable to follow this plan  Risk Prescription drug management. Decision regarding hospitalization.           Final Clinical Impression(s) / ED Diagnoses Final diagnoses:  Cervical radicular pain  Spinal stenosis, unspecified spinal region  Primary hypertension    Rx / DC Orders ED Discharge Orders          Ordered    naproxen (NAPROSYN) 500 MG tablet  2 times daily with meals        01/08/22 1759              Noemi Chapel, MD 01/08/22 Johnnye Lana

## 2022-01-08 NOTE — ED Notes (Signed)
EDP notified of bp and troponin

## 2022-03-12 ENCOUNTER — Emergency Department (HOSPITAL_COMMUNITY)
Admission: EM | Admit: 2022-03-12 | Discharge: 2022-03-12 | Disposition: A | Discharge status: Home / Self Care | Payer: Medicare Other | Attending: Emergency Medicine | Admitting: Emergency Medicine

## 2022-03-12 ENCOUNTER — Emergency Department (HOSPITAL_COMMUNITY): Payer: Medicare Other

## 2022-03-12 ENCOUNTER — Other Ambulatory Visit: Payer: Self-pay

## 2022-03-12 ENCOUNTER — Encounter (HOSPITAL_COMMUNITY): Payer: Self-pay

## 2022-03-12 DIAGNOSIS — I10 Essential (primary) hypertension: Secondary | ICD-10-CM | POA: Diagnosis not present

## 2022-03-12 DIAGNOSIS — L0390 Cellulitis, unspecified: Secondary | ICD-10-CM

## 2022-03-12 DIAGNOSIS — R221 Localized swelling, mass and lump, neck: Secondary | ICD-10-CM | POA: Diagnosis present

## 2022-03-12 DIAGNOSIS — Z96642 Presence of left artificial hip joint: Secondary | ICD-10-CM | POA: Insufficient documentation

## 2022-03-12 DIAGNOSIS — Z7982 Long term (current) use of aspirin: Secondary | ICD-10-CM | POA: Diagnosis not present

## 2022-03-12 DIAGNOSIS — Z79899 Other long term (current) drug therapy: Secondary | ICD-10-CM | POA: Insufficient documentation

## 2022-03-12 DIAGNOSIS — L03221 Cellulitis of neck: Secondary | ICD-10-CM | POA: Insufficient documentation

## 2022-03-12 LAB — CBC WITH DIFFERENTIAL/PLATELET
Abs Immature Granulocytes: 0.04 10*3/uL (ref 0.00–0.07)
Basophils Absolute: 0 10*3/uL (ref 0.0–0.1)
Basophils Relative: 0 %
Eosinophils Absolute: 0.1 10*3/uL (ref 0.0–0.5)
Eosinophils Relative: 1 %
HCT: 38.9 % — ABNORMAL LOW (ref 39.0–52.0)
Hemoglobin: 13 g/dL (ref 13.0–17.0)
Immature Granulocytes: 0 %
Lymphocytes Relative: 16 %
Lymphs Abs: 2.1 10*3/uL (ref 0.7–4.0)
MCH: 29.9 pg (ref 26.0–34.0)
MCHC: 33.4 g/dL (ref 30.0–36.0)
MCV: 89.4 fL (ref 80.0–100.0)
Monocytes Absolute: 0.8 10*3/uL (ref 0.1–1.0)
Monocytes Relative: 6 %
Neutro Abs: 10.6 10*3/uL — ABNORMAL HIGH (ref 1.7–7.7)
Neutrophils Relative %: 77 %
Platelets: 247 10*3/uL (ref 150–400)
RBC: 4.35 MIL/uL (ref 4.22–5.81)
RDW: 13.2 % (ref 11.5–15.5)
WBC: 13.7 10*3/uL — ABNORMAL HIGH (ref 4.0–10.5)
nRBC: 0 % (ref 0.0–0.2)

## 2022-03-12 LAB — COMPREHENSIVE METABOLIC PANEL
ALT: 15 U/L (ref 0–44)
AST: 19 U/L (ref 15–41)
Albumin: 3.8 g/dL (ref 3.5–5.0)
Alkaline Phosphatase: 73 U/L (ref 38–126)
Anion gap: 10 (ref 5–15)
BUN: 19 mg/dL (ref 8–23)
CO2: 23 mmol/L (ref 22–32)
Calcium: 9.5 mg/dL (ref 8.9–10.3)
Chloride: 99 mmol/L (ref 98–111)
Creatinine, Ser: 1.1 mg/dL (ref 0.61–1.24)
GFR, Estimated: >60 mL/min (ref >60)
Glucose, Bld: 94 mg/dL (ref 70–99)
Potassium: 3.9 mmol/L (ref 3.5–5.1)
Sodium: 132 mmol/L — ABNORMAL LOW (ref 135–145)
Total Bilirubin: 0.9 mg/dL (ref 0.3–1.2)
Total Protein: 8 g/dL (ref 6.5–8.1)

## 2022-03-12 LAB — LACTIC ACID, PLASMA: Lactic Acid, Venous: 0.9 mmol/L (ref 0.5–1.9)

## 2022-03-12 MED ORDER — CEPHALEXIN 500 MG PO CAPS: 500.0000 mg | ORAL_CAPSULE | Freq: Two times a day (BID) | ORAL | 0 refills | Status: AC

## 2022-03-12 MED ORDER — VANCOMYCIN HCL 1750 MG/350ML IV SOLN: 1750.0000 mg | INTRAVENOUS | Status: DC

## 2022-03-12 MED ORDER — IOHEXOL 350 MG/ML SOLN
60.0000 mL | Freq: Once | INTRAVENOUS | Status: AC | PRN
  Administered 2022-03-12: 60 mL via INTRAVENOUS

## 2022-03-12 MED ORDER — SODIUM CHLORIDE 0.9 % IV SOLN
2.0000 g | INTRAVENOUS | Status: DC
  Administered 2022-03-12: 2 g via INTRAVENOUS
  Filled 2022-03-12: qty 20

## 2022-03-12 MED ORDER — VANCOMYCIN HCL 1750 MG/350ML IV SOLN
1750.0000 mg | Freq: Once | INTRAVENOUS | Status: AC
  Administered 2022-03-12: 1750 mg via INTRAVENOUS
  Filled 2022-03-12: qty 350

## 2022-03-12 NOTE — Progress Notes (Signed)
Pharmacy Antibiotic Note ? ?Billy Bowman is a 61 y.o. male admitted on 03/12/2022 with sepsis.  Pharmacy has been consulted for vancomycin dosing. Patient presented after worsening of a bump near neck surgery incision site. Patient is afebrile, BP is stable, and HR/RR are normal. Patient is also on ceftriaxone. Will continue to monitor renal function ? ?Plan: ?Give vancomycin 1750 x 1  ?Start vancomycin 1750 every 24 hours for [facility] of 495.8 ?Levels as indicated ?Ceftriaxone per MD ?Deescalate as indicated ? ?Height: '5\' 7"'$  (170.2 cm) ?Weight: 83.9 kg (185 lb) ?IBW/kg (Calculated) : 66.1 ? ?Temp (24hrs), Avg:98.6 ?F (37 ?C), Min:98.3 ?F (36.8 ?C), Max:98.9 ?F (37.2 ?C) ? ?No results for input(s): WBC, CREATININE, LATICACIDVEN, VANCOTROUGH, VANCOPEAK, VANCORANDOM, GENTTROUGH, GENTPEAK, GENTRANDOM, TOBRATROUGH, TOBRAPEAK, TOBRARND, AMIKACINPEAK, AMIKACINTROU, AMIKACIN in the last 168 hours.  ?CrCl cannot be calculated (Patient's most recent lab result is older than the maximum 21 days allowed.).   ? ?Allergies  ?Allergen Reactions  ? Hydrochlorothiazide Swelling  ? ? ?Antimicrobials this admission: ?Ceftriaxone 3/24 >>  ?Vancomycin 3/24 >>  ? ?Dose adjustments this admission: ?none ? ?Microbiology results: ?3/24 BCx: IP ? ?Thank you for allowing pharmacy to participate in this patient's care. ? ?Reatha Harps, PharmD ?PGY1 Pharmacy Resident ?03/12/2022 2:15 PM ?Check [email or web address] for unit specific pharmacy number ? ? ?

## 2022-03-12 NOTE — ED Provider Notes (Signed)
?[facility] ?Provider Note ? ? ?CSN: [id number] ?Arrival date & time: 03/12/22  1040 ? ?  ? ?History ? ?Chief Complaint  ?Patient presents with  ? Post-op Problem  ? ? ?Billy Bowman is a 61 y.o. male. ? ?HPI ? ?  ? ?61 year old male with a history of hypertension, hyperlipidemia, cervical radiculopathy s/p anterior approach cervical surgery with Dr. Saintclair Halsted 3/13 presents with concern for area of increasing pain and swelling of anterior neck.  Notes the incision itself has not had any drainage, swelling, pain, but an area to the left of it has been swollen.  Initially had small area inflamed on Monday, over the last 4 days has gotten worse. Felt hot last night but no fevers-measured temp 99.6.  No nausea, vomiting, dyspnea, dysphonia, drooling. Had some swallowing problems mild initially after surgery but reports improvement and denies difficulty swallowing at this point.  ? ?Past Medical History:  ?Diagnosis Date  ? Arthritis   ? Heart murmur   ? child  ? Hyperlipidemia   ? Hypertension   ? Umbilical hernia   ? ?Past Surgical History:  ?Procedure Laterality Date  ? APPENDECTOMY    ? COLONOSCOPY  12/08/06  ? normal rectum/ normal colon  ? HIP ARTHROPLASTY Left 5/14  ? LUMBAR LAMINECTOMY/DECOMPRESSION MICRODISCECTOMY  11/27/2012  ? Procedure: LUMBAR LAMINECTOMY/DECOMPRESSION MICRODISCECTOMY 1 LEVEL;  Surgeon: Elaina Hoops, MD;  Location: [facility];  Service: Neurosurgery;  Laterality: Right;  right lumbar two three  ? LUMBAR LAMINECTOMY/DECOMPRESSION MICRODISCECTOMY Right 11/28/2013  ? Procedure: LUMBAR LAMINECTOMY/DECOMPRESSION MICRODISCECTOMY RIGHT LUMBAR FIVE SACRAL ONE;  Surgeon: Elaina Hoops, MD;  Location: [facility];  Service: Neurosurgery;  Laterality: Right;  ?  ? ?Home Medications ?Prior to Admission medications   ?Medication Sig Start Date End Date Taking? Authorizing Provider  ?cephALEXin (KEFLEX) 500 MG capsule Take 1 capsule (500 mg total) by mouth 2 (two) times daily  for 10 days. 03/12/22 03/22/22 Yes Curatolo, Adam, DO  ?aspirin 81 MG tablet Take 81 mg by mouth daily.    [provider]  ?atorvastatin (LIPITOR) 40 MG tablet Take 40 mg by mouth daily. 07/06/21   [provider]  ?cyclobenzaprine (FLEXERIL) 10 MG tablet Take 1 tablet (10 mg total) by mouth 3 (three) times daily as needed for muscle spasms. ?Patient not taking: No sig reported 11/28/12   Kary Kos, MD  ?cyclobenzaprine (FLEXERIL) 10 MG tablet Take 1 tablet (10 mg total) by mouth 3 (three) times daily as needed for muscle spasms. ?Patient not taking: No sig reported 11/29/13   Kary Kos, MD  ?diltiazem (DILACOR XR) 240 MG 24 hr capsule Take 240 mg by mouth daily. ?Patient not taking: No sig reported    [provider]  ?hydrochlorothiazide (HYDRODIURIL) 25 MG tablet Take 25 mg by mouth daily.    [provider]  ?HYDROcodone-acetaminophen (NORCO/VICODIN) 5-325 MG per tablet Take 1 tablet by mouth every 6 (six) hours as needed for moderate pain. ?Patient not taking: Reported on 09/29/2021    [provider]  ?lisinopril (PRINIVIL,ZESTRIL) 20 MG tablet Take 20 mg by mouth daily.    [provider]  ?metoprolol succinate (TOPROL-XL) 50 MG 24 hr tablet Take 50 mg by mouth daily. Take with or immediately following a meal. ?Patient not taking: No sig reported    [provider]  ?naproxen (NAPROSYN) 500 MG tablet Take 1 tablet (500 mg total) by mouth 2 (two) times daily with a meal. 01/08/22  Noemi Chapel, MD  ?oxyCODONE-acetaminophen (PERCOCET/ROXICET) 5-325 MG per tablet Take 1-2 tablets by mouth every 4 (four) hours as needed for moderate pain. ?Patient not taking: No sig reported 11/29/13   Kary Kos, MD  ?pravastatin (PRAVACHOL) 40 MG tablet Take 80 mg by mouth daily.    [provider]  ?tamsulosin (FLOMAX) 0.4 MG CAPS capsule Take 0.4 mg by mouth 2 (two) times daily. 08/28/21   [provider]  ?   ? ?Allergies    ?Hydrochlorothiazide    ? ?Review of Systems   ?Review of Systems ?See aboev ? ?Physical Exam ?Updated Vital Signs ?BP 140/76   Pulse 64   Temp 98.3 ?F (36.8 ?C) (Oral)   Resp 18   Ht '5\' 7"'$  (1.702 m)   Wt 83.9 kg   SpO2 99%   BMI 28.98 kg/m?  ?Physical Exam ?Vitals and nursing note reviewed.  ?Constitutional:   ?   General: He is not in acute distress. ?   Appearance: He is well-developed. He is not diaphoretic.  ?HENT:  ?   Head: Normocephalic and atraumatic.  ?Eyes:  ?   Conjunctiva/sclera: Conjunctivae normal.  ?Neck:  ?   Comments: 3cm area of induation, fluctuance, edema and tenderness to the left side of incision. No drainage from incision, no erythema surrounding the incision, has erythema to other area to the left/superior, no sublingual tongue swelling ?Cardiovascular:  ?   Rate and Rhythm: Normal rate and regular rhythm.  ?Pulmonary:  ?   Effort: Pulmonary effort is normal. No respiratory distress.  ?Musculoskeletal:  ?   Cervical back: Normal range of motion. Tenderness present.  ?Skin: ?   General: Skin is warm and dry.  ?Neurological:  ?   Mental Status: He is alert and oriented to person, place, and time.  ? ? ?ED Results / Procedures / Treatments   ?Labs ?(all labs ordered are listed, but only abnormal results are displayed) ?Labs Reviewed  ?CBC WITH DIFFERENTIAL/PLATELET - Abnormal; Notable for the following components:  ?    Result Value  ? WBC 13.7 (*)   ? HCT 38.9 (*)   ? Neutro Abs 10.6 (*)   ? All other components within normal limits  ?COMPREHENSIVE METABOLIC PANEL - Abnormal; Notable for the following components:  ? Sodium 132 (*)   ? All other components within normal limits  ?CULTURE, BLOOD (ROUTINE X 2)  ?CULTURE, BLOOD (ROUTINE X 2)  ?LACTIC ACID, PLASMA  ?LACTIC ACID, PLASMA  ? ? ?EKG ?None ? ?Radiology ?CT Soft Tissue Neck W Contrast ? ?Result Date: 03/12/2022 ?CLINICAL DATA:  Soft tissue swelling, infection suspected, neck xray done EXAM: CT NECK WITH CONTRAST TECHNIQUE: Multidetector CT imaging of the  neck was performed using the standard protocol following the bolus administration of intravenous contrast. RADIATION DOSE REDUCTION: This exam was performed according to the departmental dose-optimization program which includes automated exposure control, adjustment of the mA and/or kV according to patient size and/or use of iterative reconstruction technique. CONTRAST:  21m OMNIPAQUE IOHEXOL 350 MG/ML SOLN COMPARISON:  None. FINDINGS: Pharynx and larynx: Normal. No mass or swelling. Salivary glands: No inflammation, mass, or stone. Thyroid: Normal. Lymph nodes: Borderline enlarged submental lymph nodes. Vascular: Limited assessment due to non arterial timing. Visualized major arteries appear grossly patent in the neck. Bilateral carotid bifurcation atherosclerosis. Limited intracranial: Negative. Visualized orbits: Negative. Mastoids and visualized paranasal sinuses: Mild paranasal sinus mucosal thickening. No mastoid effusions. Skeleton: Recent ACDF spanning C4-C7 with intervening spacers. The inferior aspect of  the plate is mildly proud (1.5 mm). This area was not well assessed on intraoperative fluoroscopy. No evidence of hardware fracture. No bony fusion across the disc spaces. No evidence of acute fracture or traumatic malalignment. Upper chest: Visualized lung apices are clear. Other: Subcutaneous soft tissue edema and thickening extending from the mandible inferiorly in the mid to upper left neck. Suspected mild prevertebral edema at the levels of recent ACDF. IMPRESSION: 1. Soft tissue edema and thickening extending from the mandible inferiorly in the mid to upper left neck with probable mild prevertebral edema. Given the patient's reported recent cervical ACDF, this could represent any combination of postsurgical change or superimposed infection. No discrete, drainable fluid collection. If there is concern for spine infection, MRI of the cervical spine with contrast could further evaluate. 2. Recent ACDF  spanning C4-C7 with intervening spacers. The inferior aspect of the plate is mildly proud (1.5 mm). This area was not well assessed on intraoperative fluoroscopy. 3. Borderline enlarged submental lymph node

## 2022-03-12 NOTE — ED Triage Notes (Signed)
Pt had neck surgery 3/13 and then noticed a bump beside his incision on Tuesday this past week. Since Tuesday the bump has gotten bigger and is tender to touch.  ?

## 2022-03-12 NOTE — Discharge Instructions (Addendum)
Follow-up with neurosurgery next week.  Please return if symptoms worsen as discussed.  Take next dose antibiotic tomorrow morning. ?

## 2022-03-12 NOTE — ED Provider Notes (Signed)
Took over care of patient at 4 pm.  Patient with some swelling near her surgical site from anterior ACDF surgery 2 weeks ago.  He is getting CT scan.  Neurosurgery to see.  Of note right now he has no fever, no major leukocytosis, no lactic acidosis. ? ?Per radiology report CT scan shows soft tissue edema and thickening extending from the mandible inferiorly in the mid to upper left neck with probable mild prevertebral edema.  Likely infection versus inflammation.  No obvious abscess.  Dr. Lucille Passy with neurosurgery evaluated the patient.  Overall possibly a mild cellulitis and will treat with Keflex.  Patient already got a dose IV antibiotics here.  We will follow-up closely outpatient.  Already has follow-up in place next week.  Discharged in good condition. ? ?This chart was dictated using voice recognition software.  Despite best efforts to proofread,  errors can occur which can change the documentation meaning.  ?  Lennice Sites, DO ?03/12/22 1819 ? ?

## 2022-03-12 NOTE — ED Notes (Signed)
E-signature pad unavailable at time of pt discharge. This RN discussed discharge materials with pt and answered all pt questions. Pt stated understanding of discharge material. ? ?

## 2022-03-12 NOTE — Progress Notes (Signed)
?  NEUROSURGERY PROGRESS NOTE  ? ?Patient presenting to the ED approximately 11 days status post ACDF from a right-sided approach by Dr. Saintclair Halsted.  He noted a small "pimple" on the left side of his neck a few days ago which has progressed to be more firm and tender to the touch.  He has not been running any fevers.  No drainage from the area.  He did have some expected mild postoperative dysphagia which is significantly improving.  No shortness of breath or difficulty breathing. ? ?EXAM:  ?BP 132/78 (BP Location: Right Arm)   Pulse 62   Temp 98.3 ?F (36.8 ?C) (Oral)   Resp 16   Ht '5\' 7"'$  (1.702 m)   Wt 83.9 kg   SpO2 100%   BMI 28.98 kg/m?  ? ?Awake, alert, oriented  ?Speech fluent, appropriate  ?CN grossly intact  ?5/5 BUE/BLE  ?Right sided wound is dressed with Steri-Strips and is clean and dry.  No significant right-sided induration or tenderness.  There is a approximately 3 cm area in the left neck which is erythematous and tender.  No active drainage from this area. ? ?IMPRESSION:  ?61 y.o. male 11 days status post ACDF from a right-sided approach.  Suspect he has small abscess or folliculitis in the left neck unrelated to his recent surgery. ? ?PLAN: ?-patient can be treated with a course of oral antibiotics ?-Patient is already scheduled for routine outpatient follow-up this coming Tuesday ? ? ?Consuella Lose, MD ?[facility]  ? ?

## 2022-03-13 ENCOUNTER — Emergency Department (HOSPITAL_COMMUNITY)
Admission: EM | Admit: 2022-03-13 | Discharge: 2022-03-13 | Disposition: A | Discharge status: Home / Self Care | Payer: Medicare Other | Attending: Emergency Medicine | Admitting: Emergency Medicine

## 2022-03-13 ENCOUNTER — Emergency Department (HOSPITAL_COMMUNITY): Payer: Medicare Other

## 2022-03-13 DIAGNOSIS — Z7982 Long term (current) use of aspirin: Secondary | ICD-10-CM | POA: Insufficient documentation

## 2022-03-13 DIAGNOSIS — R221 Localized swelling, mass and lump, neck: Secondary | ICD-10-CM | POA: Diagnosis present

## 2022-03-13 DIAGNOSIS — L0211 Cutaneous abscess of neck: Secondary | ICD-10-CM | POA: Diagnosis not present

## 2022-03-13 DIAGNOSIS — D72829 Elevated white blood cell count, unspecified: Secondary | ICD-10-CM | POA: Diagnosis not present

## 2022-03-13 LAB — CBC WITH DIFFERENTIAL/PLATELET
Abs Immature Granulocytes: 0.05 10*3/uL (ref 0.00–0.07)
Basophils Absolute: 0.1 10*3/uL (ref 0.0–0.1)
Basophils Relative: 0 %
Eosinophils Absolute: 0.2 10*3/uL (ref 0.0–0.5)
Eosinophils Relative: 2 %
HCT: 35.5 % — ABNORMAL LOW (ref 39.0–52.0)
Hemoglobin: 11.9 g/dL — ABNORMAL LOW (ref 13.0–17.0)
Immature Granulocytes: 0 %
Lymphocytes Relative: 20 %
Lymphs Abs: 2.4 10*3/uL (ref 0.7–4.0)
MCH: 29.6 pg (ref 26.0–34.0)
MCHC: 33.5 g/dL (ref 30.0–36.0)
MCV: 88.3 fL (ref 80.0–100.0)
Monocytes Absolute: 0.8 10*3/uL (ref 0.1–1.0)
Monocytes Relative: 6 %
Neutro Abs: 8.6 10*3/uL — ABNORMAL HIGH (ref 1.7–7.7)
Neutrophils Relative %: 72 %
Platelets: 265 10*3/uL (ref 150–400)
RBC: 4.02 MIL/uL — ABNORMAL LOW (ref 4.22–5.81)
RDW: 13.2 % (ref 11.5–15.5)
WBC: 12 10*3/uL — ABNORMAL HIGH (ref 4.0–10.5)
nRBC: 0 % (ref 0.0–0.2)

## 2022-03-13 LAB — BASIC METABOLIC PANEL
Anion gap: 9 (ref 5–15)
BUN: 25 mg/dL — ABNORMAL HIGH (ref 8–23)
CO2: 26 mmol/L (ref 22–32)
Calcium: 9.1 mg/dL (ref 8.9–10.3)
Chloride: 100 mmol/L (ref 98–111)
Creatinine, Ser: 1.18 mg/dL (ref 0.61–1.24)
GFR, Estimated: >60 mL/min (ref >60)
Glucose, Bld: 98 mg/dL (ref 70–99)
Potassium: 4.3 mmol/L (ref 3.5–5.1)
Sodium: 135 mmol/L (ref 135–145)

## 2022-03-13 MED ORDER — CLINDAMYCIN HCL 150 MG PO CAPS: 300.0000 mg | ORAL_CAPSULE | Freq: Three times a day (TID) | ORAL | 0 refills | Status: AC

## 2022-03-13 MED ORDER — SODIUM CHLORIDE 0.9 % IV BOLUS
1000.0000 mL | Freq: Once | INTRAVENOUS | Status: AC
  Administered 2022-03-13: 1000 mL via INTRAVENOUS

## 2022-03-13 MED ORDER — CLINDAMYCIN HCL 150 MG PO CAPS
300.0000 mg | ORAL_CAPSULE | Freq: Once | ORAL | Status: AC
  Administered 2022-03-13: 300 mg via ORAL
  Filled 2022-03-13: qty 2

## 2022-03-13 MED ORDER — IOHEXOL 350 MG/ML SOLN
75.0000 mL | Freq: Once | INTRAVENOUS | Status: AC | PRN
  Administered 2022-03-13: 75 mL via INTRAVENOUS

## 2022-03-13 NOTE — ED Provider Notes (Signed)
?[facility] ?Provider Note ? ? ?CSN: [id number] ?Arrival date & time: 03/13/22  1552 ? ?  ? ?History ? ?Chief Complaint  ?Patient presents with  ? Abscess  ? ? ?Billy Bowman is a 61 y.o. male who presents emergency department with chief complaint of neck pain and swelling.  Patient underwent anterior approach cervical surgery with Dr. Saintclair Halsted on 03/01/2022.  He was seen yesterday morning with concern of increased pain and swelling of the anterior neck.  He had a work-up including CT imaging that showed inflammation versus cellulitis, was given IV antibiotics and discharged on Keflex after a bedside evaluation with neurosurgery.  Patient returns today because he and his wife both noticed that his neck was more swollen than it was yesterday.  He has increased pain and difficulty with swallowing and has now noticed a change in his voice.  He denies fever or severe pain.  His pain is well controlled with his pain medications given to him at discharge for his surgery. ? ? ?Abscess ? ?  ? ?Home Medications ?Prior to Admission medications   ?Medication Sig Start Date End Date Taking? Authorizing Provider  ?aspirin 81 MG tablet Take 81 mg by mouth daily.    [provider]  ?atorvastatin (LIPITOR) 40 MG tablet Take 40 mg by mouth daily. 07/06/21   [provider]  ?cephALEXin (KEFLEX) 500 MG capsule Take 1 capsule (500 mg total) by mouth 2 (two) times daily for 10 days. 03/12/22 03/22/22  Lennice Sites, DO  ?cyclobenzaprine (FLEXERIL) 10 MG tablet Take 1 tablet (10 mg total) by mouth 3 (three) times daily as needed for muscle spasms. ?Patient not taking: No sig reported 11/28/12   Kary Kos, MD  ?cyclobenzaprine (FLEXERIL) 10 MG tablet Take 1 tablet (10 mg total) by mouth 3 (three) times daily as needed for muscle spasms. ?Patient not taking: No sig reported 11/29/13   Kary Kos, MD  ?diltiazem (DILACOR XR) 240 MG 24 hr capsule Take 240 mg by mouth daily. ?Patient not  taking: No sig reported    [provider]  ?hydrochlorothiazide (HYDRODIURIL) 25 MG tablet Take 25 mg by mouth daily.    [provider]  ?HYDROcodone-acetaminophen (NORCO/VICODIN) 5-325 MG per tablet Take 1 tablet by mouth every 6 (six) hours as needed for moderate pain. ?Patient not taking: Reported on 09/29/2021    [provider]  ?lisinopril (PRINIVIL,ZESTRIL) 20 MG tablet Take 20 mg by mouth daily.    [provider]  ?metoprolol succinate (TOPROL-XL) 50 MG 24 hr tablet Take 50 mg by mouth daily. Take with or immediately following a meal. ?Patient not taking: No sig reported    [provider]  ?naproxen (NAPROSYN) 500 MG tablet Take 1 tablet (500 mg total) by mouth 2 (two) times daily with a meal. 01/08/22   Noemi Chapel, MD  ?oxyCODONE-acetaminophen (PERCOCET/ROXICET) 5-325 MG per tablet Take 1-2 tablets by mouth every 4 (four) hours as needed for moderate pain. ?Patient not taking: No sig reported 11/29/13   Kary Kos, MD  ?pravastatin (PRAVACHOL) 40 MG tablet Take 80 mg by mouth daily.    [provider]  ?tamsulosin (FLOMAX) 0.4 MG CAPS capsule Take 0.4 mg by mouth 2 (two) times daily. 08/28/21   [provider]  ?   ? ?Allergies    ?Hydrochlorothiazide   ? ?Review of Systems   ?Review of Systems  ?HENT:  Positive for trouble swallowing and voice change.   ? ?Physical Exam ?  Updated Vital Signs ?BP (!) 137/91   Pulse 87   Temp 98.8 ?F (37.1 ?C) (Oral)   Resp 18   SpO2 97%  ?Physical Exam ?Vitals and nursing note reviewed.  ?Constitutional:   ?   General: He is not in acute distress. ?   Appearance: He is well-developed. He is not diaphoretic.  ?HENT:  ?   Head: Normocephalic and atraumatic.  ?Eyes:  ?   General: No scleral icterus. ?   Conjunctiva/sclera: Conjunctivae normal.  ?Neck:  ?   Comments: Well-healing surgical incision at the base of the right anterior triangle of the neck.  There is swelling over the trachea and Adam's apple  region worse on the left side.  No significant fullness of the hypoglossal region.  Mild voice muffling noted ?Cardiovascular:  ?   Rate and Rhythm: Normal rate and regular rhythm.  ?   Heart sounds: Normal heart sounds.  ?Pulmonary:  ?   Effort: Pulmonary effort is normal. No respiratory distress.  ?   Breath sounds: Normal breath sounds.  ?Abdominal:  ?   Palpations: Abdomen is soft.  ?   Tenderness: There is no abdominal tenderness.  ?Musculoskeletal:  ?   Cervical back: Tenderness present.  ?Skin: ?   General: Skin is warm and dry.  ?Neurological:  ?   Mental Status: He is alert.  ?Psychiatric:     ?   Behavior: Behavior normal.  ? ? ?ED Results / Procedures / Treatments   ?Labs ?(all labs ordered are listed, but only abnormal results are displayed) ?Labs Reviewed - No data to display ? ?EKG ?None ? ?Radiology ?CT Soft Tissue Neck W Contrast ? ?Result Date: 03/12/2022 ?CLINICAL DATA:  Soft tissue swelling, infection suspected, neck xray done EXAM: CT NECK WITH CONTRAST TECHNIQUE: Multidetector CT imaging of the neck was performed using the standard protocol following the bolus administration of intravenous contrast. RADIATION DOSE REDUCTION: This exam was performed according to the departmental dose-optimization program which includes automated exposure control, adjustment of the mA and/or kV according to patient size and/or use of iterative reconstruction technique. CONTRAST:  48m OMNIPAQUE IOHEXOL 350 MG/ML SOLN COMPARISON:  None. FINDINGS: Pharynx and larynx: Normal. No mass or swelling. Salivary glands: No inflammation, mass, or stone. Thyroid: Normal. Lymph nodes: Borderline enlarged submental lymph nodes. Vascular: Limited assessment due to non arterial timing. Visualized major arteries appear grossly patent in the neck. Bilateral carotid bifurcation atherosclerosis. Limited intracranial: Negative. Visualized orbits: Negative. Mastoids and visualized paranasal sinuses: Mild paranasal sinus mucosal  thickening. No mastoid effusions. Skeleton: Recent ACDF spanning C4-C7 with intervening spacers. The inferior aspect of the plate is mildly proud (1.5 mm). This area was not well assessed on intraoperative fluoroscopy. No evidence of hardware fracture. No bony fusion across the disc spaces. No evidence of acute fracture or traumatic malalignment. Upper chest: Visualized lung apices are clear. Other: Subcutaneous soft tissue edema and thickening extending from the mandible inferiorly in the mid to upper left neck. Suspected mild prevertebral edema at the levels of recent ACDF. IMPRESSION: 1. Soft tissue edema and thickening extending from the mandible inferiorly in the mid to upper left neck with probable mild prevertebral edema. Given the patient's reported recent cervical ACDF, this could represent any combination of postsurgical change or superimposed infection. No discrete, drainable fluid collection. If there is concern for spine infection, MRI of the cervical spine with contrast could further evaluate. 2. Recent ACDF spanning C4-C7 with intervening spacers. The inferior aspect of the plate is  mildly proud (1.5 mm). This area was not well assessed on intraoperative fluoroscopy. 3. Borderline enlarged submental lymph nodes, probably reactive given the above findings. Electronically Signed   By: Margaretha Sheffield M.D.   On: 03/12/2022 17:12   ? ?Procedures ?Procedures  ? ? ?Medications Ordered in ED ?Medications - No data to display ? ?ED Course/ Medical Decision Making/ A&P ?Clinical Course as of 03/14/22 0014  ?Sat Mar 13, 2022  ?1745 Patient returns for reevaluation of neck swelling after surgery.  I have placed an order to consult with neurosurgery [AH]  ?2002 CT Soft Tissue Neck W Contrast [AH]  ?2002 CBC with Differential(!) ?White blood cell count improved from yesterday [AH]  ?3818 Basic metabolic panel(!) [AH]  ?  ?Clinical Course User Index ?[AH] Margarita Mail, PA-C  ? ?                        ?Medical  Decision Making ?61 year old male here with throat swelling.  This is the second night the patient has been evaluated.  Case discussed with Dr. Kathyrn Sheriff who does not feel that his swelling is due to the anterior approac

## 2022-03-13 NOTE — Discharge Instructions (Signed)
Your CT scan appears to be improved from yesterday.  We are going to send you home with a new antibiotic that has broader coverage. ?Please give this antibiotic 36 to 48 hours to take full effect.  You should return immediately if you have severe swelling in your throat that is markedly different from the current swelling you have, inability to swallow saliva, or difficulty breathing.  Should return if you have change in your mental status, inability to stay awake, high fever. ?

## 2022-03-13 NOTE — ED Triage Notes (Signed)
Pt c/o increasing size of "bump" on neck, states he was seen yesterday for same, full workup & advised to come back if bump got bigger. DC w abx yesterday, took first dose this AM. Denies difficulty swallowing, breathing, only worried about increase in size.  ?

## 2022-03-17 LAB — CULTURE, BLOOD (ROUTINE X 2)
Culture: NO GROWTH
Special Requests: ADEQUATE

## 2022-06-13 ENCOUNTER — Ambulatory Visit (HOSPITAL_COMMUNITY)
Admission: EM | Admit: 2022-06-13 | Discharge: 2022-06-13 | Disposition: A | Discharge status: Home / Self Care | Payer: Medicare Other | Attending: Emergency Medicine | Admitting: Emergency Medicine

## 2022-06-13 ENCOUNTER — Encounter (HOSPITAL_COMMUNITY): Payer: Self-pay | Admitting: Emergency Medicine

## 2022-06-13 DIAGNOSIS — L0201 Cutaneous abscess of face: Secondary | ICD-10-CM | POA: Diagnosis not present

## 2022-06-13 MED ORDER — DOXYCYCLINE HYCLATE 100 MG PO CAPS: 100.0000 mg | ORAL_CAPSULE | Freq: Two times a day (BID) | ORAL | 0 refills | Status: DC

## 2022-06-13 NOTE — ED Triage Notes (Signed)
Pt had knot on neck that started back in March. Has been to ED for it before and told abscess and given antibiotics. Reports will go up and down in size. Denies pain. Been putting warm compress on it.

## 2024-01-29 IMAGING — CT CT NECK W/ CM
3 of 4 series · 13 of 35 positions shown, 16 images · IV contrast (APPLIED)
Comparison: CT neck dated 1 day prior

CLINICAL DATA: Soft tissue swelling on the left

EXAM:
CT NECK WITH CONTRAST
TECHNIQUE: Multidetector CT imaging of the neck was performed using the
standard protocol following the bolus administration of intravenous
contrast.

[Series 5: coronal st · coronal · 0.41mm/px · 3 of 101 slices shown]
[im 21/101  bone]
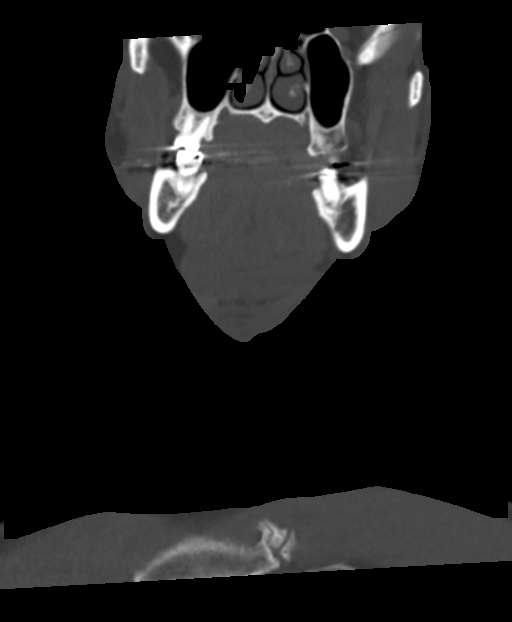
[im 41/101  bone]
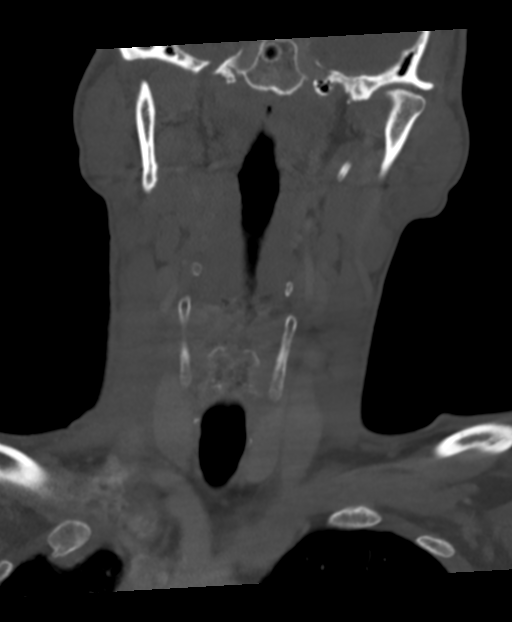
[im 61/101  bone]
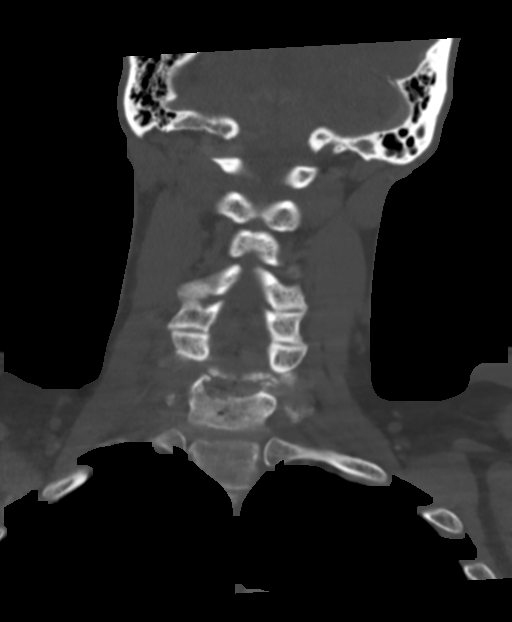

[Series 6: sagittal st · sagittal · 0.43mm/px · 5 of 88 slices shown, 6 images]
[im 30/88  bone]
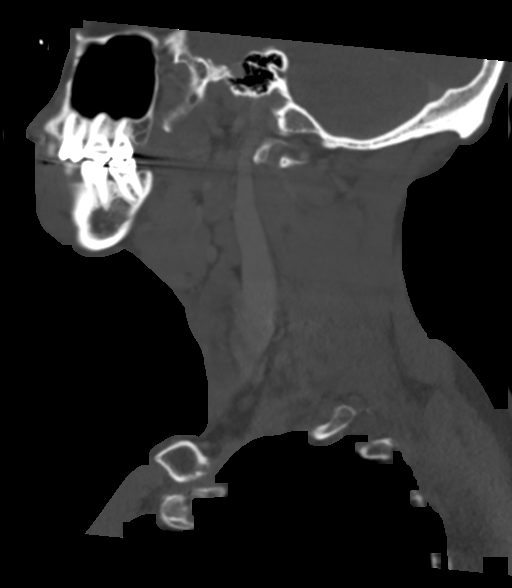
[im 37/88  bone]
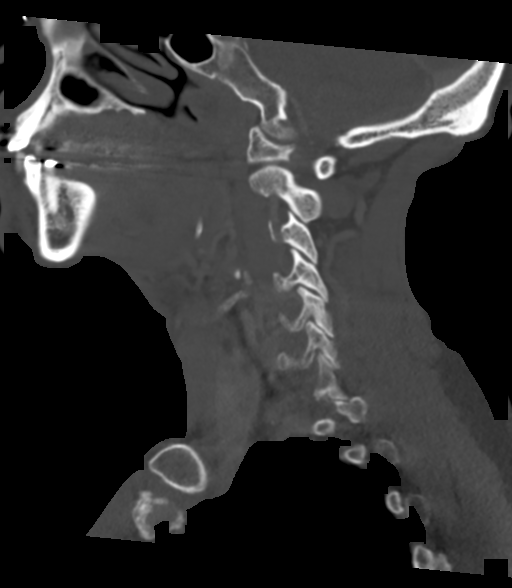
[im 44/88  soft-tissue]
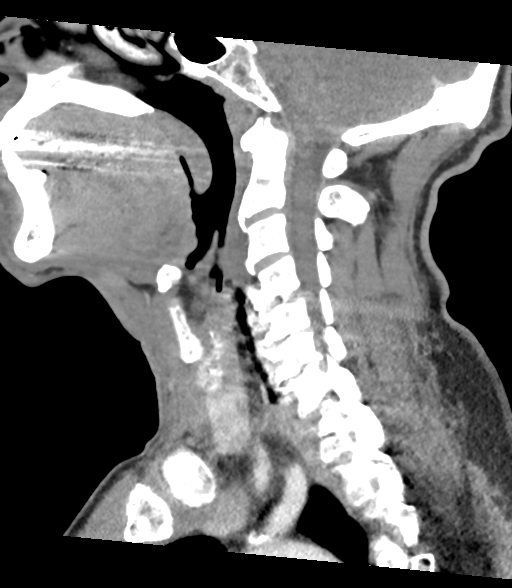
[im 44/88  bone]
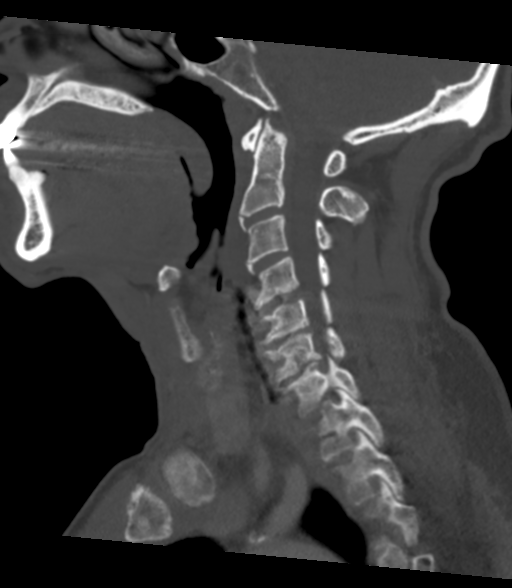
[im 51/88  bone]
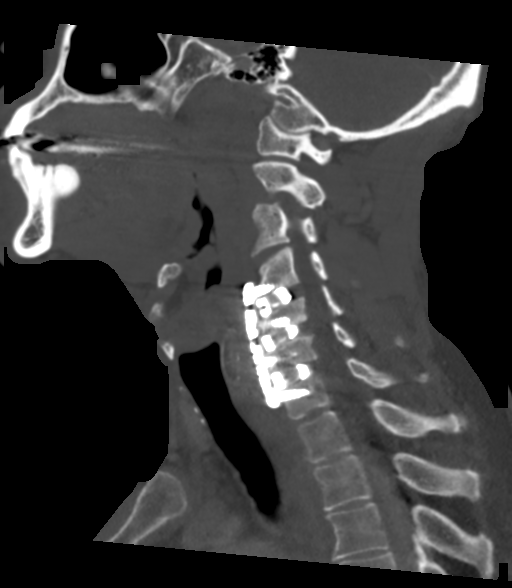
[im 59/88  bone]
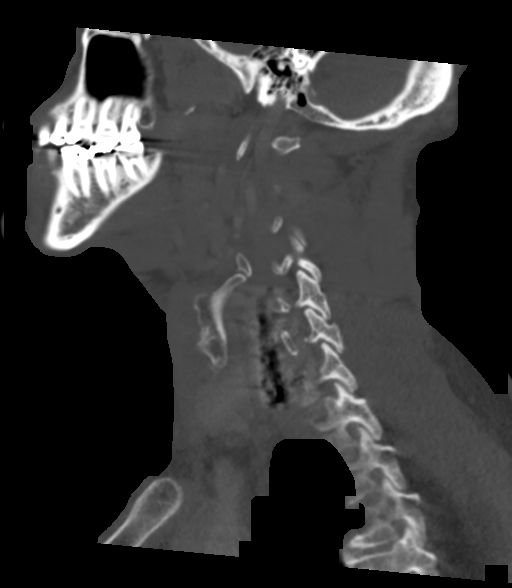

[Series 7: orthogonal st · axial · 0.37mm/px · z∈[-232,-77]mm · 5 of 119 slices shown, 7 images]
[im 20/119  soft-tissue]
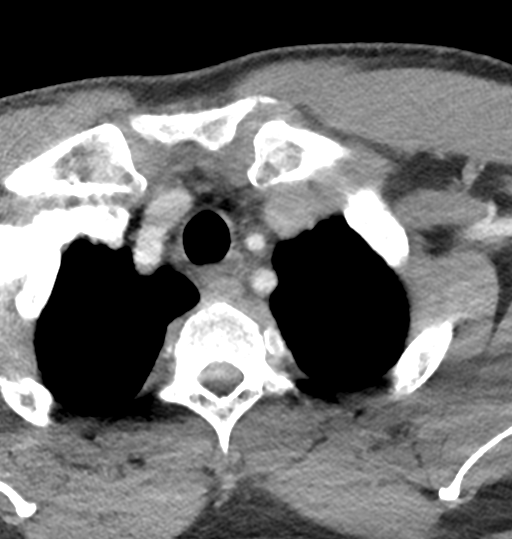
[im 20/119  bone]
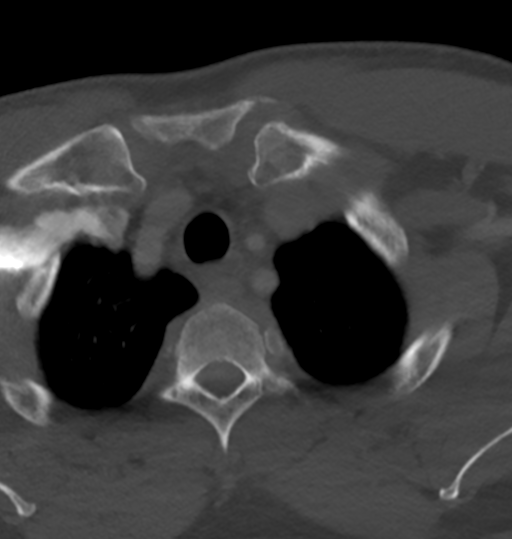
[im 40/119  bone]
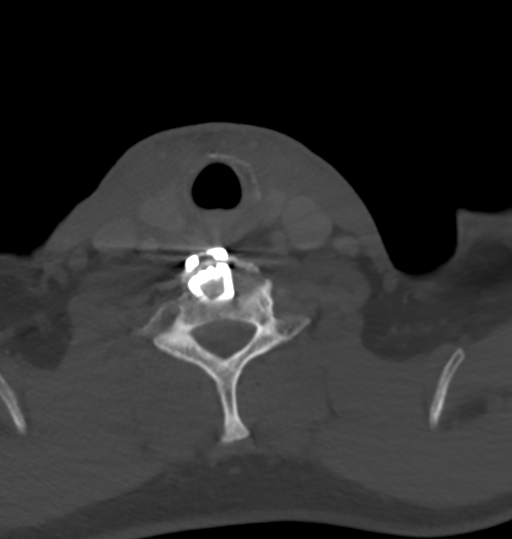
[im 60/119  bone]
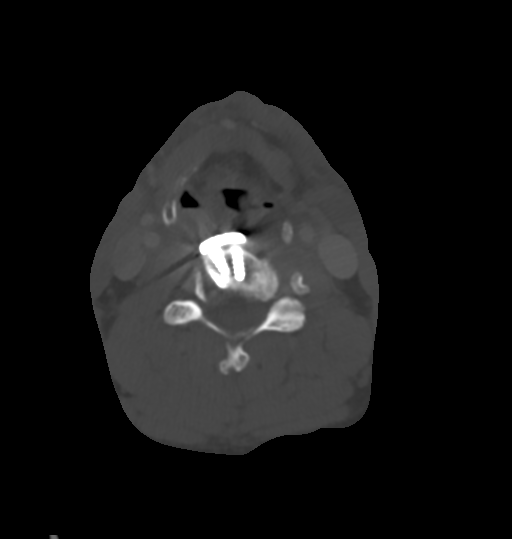
[im 79/119  bone]
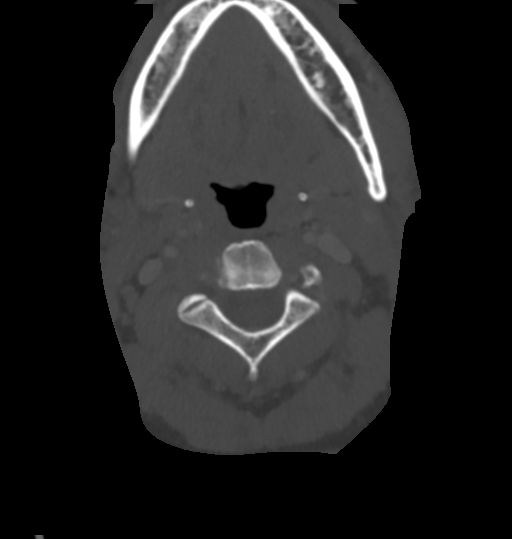
[im 99/119  soft-tissue]
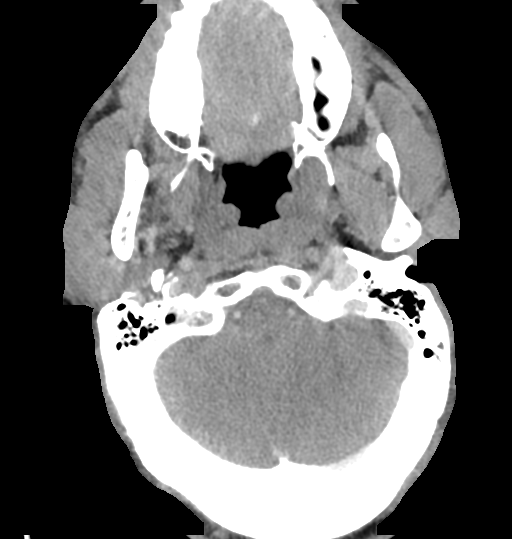
[im 99/119  bone]
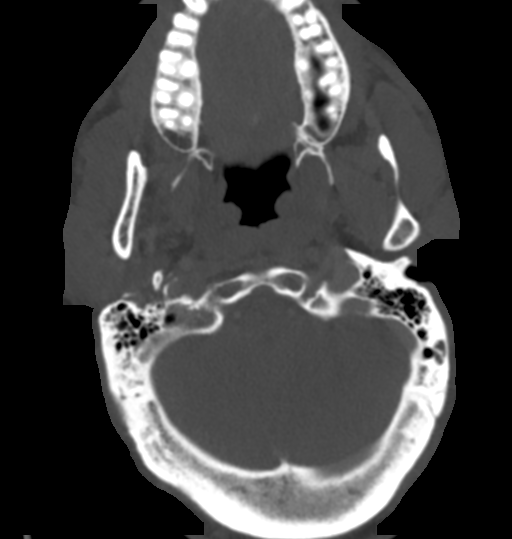

[13 of 35 positions shown; findings below may reference images not displayed]

RADIATION DOSE REDUCTION: This exam was performed according to the
departmental dose-optimization program which includes automated
exposure control, adjustment of the mA and/or kV according to
patient size and/or use of iterative reconstruction technique.

CONTRAST:  75mL OMNIPAQUE IOHEXOL 350 MG/ML SOLN
FINDINGS: Pharynx and larynx: The nasal cavity and nasopharynx are
unremarkable.

The oral cavity and oropharynx are unremarkable. The parapharyngeal
spaces are clear.

Hypopharynx and larynx are unremarkable.

Salivary glands: The parotid and submandibular glands are
unremarkable.

Thyroid: Unremarkable.

Lymph nodes: There prominent submental lymph nodes measuring up to
1.0 cm. There is a 9 mm right level IIA lymph node and 8 mm left
level IIA lymph node. There is an 8 mm left level IV lymph node.
These nodes are nonspecific and not pathologically enlarged by size
criteria. Pathologically enlarged by CT size criteria.

Vascular: Is calcified plaque in the bilateral proximal internal
carotid arteries. The internal jugular veins are patent.

Limited intracranial: The imaged portions of the intracranial
compartment are unremarkable.

Visualized orbits: Not included within the field of view.

Mastoids and visualized paranasal sinuses: The imaged paranasal
sinuses and mastoid air cells are clear.

Skeleton: Postsurgical changes reflecting recent C4 through C7 ACDF
are again seen. The inferior portion of the plate is approximally 2
mm backed out from the anterior C7 endplate, unchanged. Otherwise,
hardware alignment is within expected limits. There is no acute
osseous abnormality or aggressive osseous lesion. There is no
definite canal hematoma.

Upper chest: The imaged lung apices are clear.

Other: There is soft tissue thickening with subcutaneous fat
stranding throughout the left side of the neck, similar to the study
from 1 day prior. There is no organized or drainable fluid
collection. There is no significant prevertebral soft tissue
swelling or evidence of prevertebral fluid collection.
IMPRESSION: 1. Overall, no significant change since the study from 1 day prior.
Soft tissue thickening/inflammation in the left neck may reflect
postsurgical change and/or infection (cellulitis). There is no
organized or drainable fluid collection.
2. Prominent bilateral cervical chain lymph nodes are likely
reactive.
3. Stable postsurgical changes reflecting recent C4 through C7 ACDF.

## 2024-02-22 ENCOUNTER — Other Ambulatory Visit (HOSPITAL_BASED_OUTPATIENT_CLINIC_OR_DEPARTMENT_OTHER): Payer: Self-pay | Admitting: Family Medicine

## 2024-02-22 ENCOUNTER — Other Ambulatory Visit (HOSPITAL_COMMUNITY): Payer: Self-pay | Admitting: General Practice

## 2024-02-22 DIAGNOSIS — R2230 Localized swelling, mass and lump, unspecified upper limb: Secondary | ICD-10-CM

## 2024-02-27 ENCOUNTER — Ambulatory Visit (HOSPITAL_COMMUNITY)
Admission: RE | Admit: 2024-02-27 | Discharge: 2024-02-27 | Disposition: A | Discharge status: Home / Self Care | Source: Ambulatory Visit | Attending: Family Medicine | Admitting: Family Medicine

## 2024-02-27 DIAGNOSIS — R2230 Localized swelling, mass and lump, unspecified upper limb: Secondary | ICD-10-CM | POA: Insufficient documentation

## 2024-10-15 ENCOUNTER — Encounter: Payer: Self-pay | Admitting: Gastroenterology

## 2024-11-07 ENCOUNTER — Ambulatory Visit (AMBULATORY_SURGERY_CENTER)

## 2024-11-07 VITALS — Ht 67.0 in | Wt 204.0 lb

## 2024-11-07 DIAGNOSIS — Z860100 Personal history of colon polyps, unspecified: Secondary | ICD-10-CM

## 2024-11-07 MED ORDER — NA SULFATE-K SULFATE-MG SULF 17.5-3.13-1.6 GM/177ML PO SOLN: 1.0000 | Freq: Once | ORAL | 0 refills | Status: AC

## 2024-11-07 NOTE — Progress Notes (Signed)
 PCP MD at time of PV: Shona Rush MD  __________________________________________________________________________________________________________________________________________  No egg allergy known to patient  No soy allergy known to patient No issues known to pt with past sedation with any surgeries or procedures Patient denies ever being told they had issues or difficulty with intubation  No FH of Malignant Hyperthermia Pt is not on diet pills Pt is not on  home 02  Pt is not on blood thinners  No A fib or A flutter Have any cardiac testing pending--no LOA: independent No Chew or Snuff tobacco __________________________________________________________________________________________________________________________________________  Constipation: no  Prep: suprep  __________________________________________________________________________________________________________________________________________  PV completed with patient. Prep instructions reviewed and provided during apt. Rx sent to preferred pharmacy.  __________________________________________________________________________________________________________________________________________  Patient's chart reviewed by Rush Schillings CNRA prior to previsit and patient appropriate for the [facility].  Previsit completed and red dot placed by patient's name on their procedure day (on provider's schedule).

## 2024-11-12 ENCOUNTER — Encounter: Payer: Self-pay | Admitting: Gastroenterology

## 2024-11-21 ENCOUNTER — Ambulatory Visit: Admitting: Gastroenterology

## 2024-11-21 ENCOUNTER — Encounter: Payer: Self-pay | Admitting: Gastroenterology

## 2024-11-21 VITALS — BP 148/83 | HR 56 | Temp 97.9°F | Resp 11 | Ht 67.0 in | Wt 204.0 lb

## 2024-11-21 DIAGNOSIS — Z1211 Encounter for screening for malignant neoplasm of colon: Secondary | ICD-10-CM

## 2024-11-21 DIAGNOSIS — D125 Benign neoplasm of sigmoid colon: Secondary | ICD-10-CM | POA: Diagnosis not present

## 2024-11-21 DIAGNOSIS — D124 Benign neoplasm of descending colon: Secondary | ICD-10-CM

## 2024-11-21 DIAGNOSIS — Z860101 Personal history of adenomatous and serrated colon polyps: Secondary | ICD-10-CM | POA: Diagnosis not present

## 2024-11-21 DIAGNOSIS — K635 Polyp of colon: Secondary | ICD-10-CM

## 2024-11-21 DIAGNOSIS — Z860100 Personal history of colon polyps, unspecified: Secondary | ICD-10-CM

## 2024-11-21 MED ORDER — SODIUM CHLORIDE 0.9 % IV SOLN: 500.0000 mL | Freq: Once | INTRAVENOUS | Status: DC

## 2024-11-21 NOTE — Patient Instructions (Addendum)

## 2024-11-21 NOTE — Op Note (Signed)
 [facility] Patient Name: Deran Barro Procedure Date: 11/21/2024 9:15 AM MRN: [id number] Endoscopist: Glendia E. Stacia , MD, [id number] Age: 63 Referring MD:  Date of Birth: 08-25-1961 Gender: Male Account #: [id number] Procedure:                Colonoscopy Indications:              High risk colon cancer surveillance: Personal                            history of multiple (3 or more) adenomas, Last                            colonoscopy: October 2022 Medicines:                Monitored Anesthesia Care Procedure:                Pre-Anesthesia Assessment:                           - Prior to the procedure, a History and Physical                            was performed, and patient medications and                            allergies were reviewed. The patient's tolerance of                            previous anesthesia was also reviewed. The risks                            and benefits of the procedure and the sedation                            options and risks were discussed with the patient.                            All questions were answered, and informed consent                            was obtained. Prior Anticoagulants: The patient has                            taken no anticoagulant or antiplatelet agents. ASA                            Grade Assessment: II - A patient with mild systemic                            disease. After reviewing the risks and benefits,                            the patient was deemed in satisfactory condition to  undergo the procedure.                           After obtaining informed consent, the colonoscope                            was passed under direct vision. Throughout the                            procedure, the patient's blood pressure, pulse, and                            oxygen saturations were monitored continuously. The                            CF [id number] #[id number] was introduced  through the anus                            and advanced to the the terminal ileum, with                            identification of the appendiceal orifice and IC                            valve. The colonoscopy was performed without                            difficulty. The patient tolerated the procedure                            well. The quality of the bowel preparation was                            excellent. The terminal ileum, ileocecal valve,                            appendiceal orifice, and rectum were photographed.                            The bowel preparation used was SUPREP via split                            dose instruction. Scope In: 9:26:36 AM Scope Out: 9:48:59 AM Scope Withdrawal Time: 0 hours 14 minutes 16 seconds  Total Procedure Duration: 0 hours 22 minutes 23 seconds  Findings:                 The perianal and digital rectal examinations were                            normal. Pertinent negatives include normal                            sphincter tone and no palpable rectal lesions.  A 3 mm polyp was found in the descending colon. The                            polyp was sessile. The polyp was removed with a                            cold snare. Resection and retrieval were complete.                            Estimated blood loss was minimal.                           A 3 mm polyp was found in the sigmoid colon. The                            polyp was sessile. The polyp was removed with a                            cold snare. Resection and retrieval were complete.                            Estimated blood loss was minimal.                           A 7 mm polyp was found in the sigmoid colon. The                            polyp was flat. The polyp was removed with a cold                            snare. Resection and retrieval were complete.                            Estimated blood loss was minimal.                            The exam was otherwise normal throughout the                            examined colon.                           The terminal ileum appeared normal.                           The retroflexed view of the distal rectum and anal                            verge was normal and showed no anal or rectal                            abnormalities. Complications:            No immediate complications. Estimated  Blood Loss:     Estimated blood loss was minimal. Impression:               - One 3 mm polyp in the descending colon, removed                            with a cold snare. Resected and retrieved.                           - One 3 mm polyp in the sigmoid colon, removed with                            a cold snare. Resected and retrieved.                           - One 7 mm polyp in the sigmoid colon, removed with                            a cold snare. Resected and retrieved.                           - The examined portion of the ileum was normal.                           - The distal rectum and anal verge are normal on                            retroflexion view. Recommendation:           - Patient has a contact number available for                            emergencies. The signs and symptoms of potential                            delayed complications were discussed with the                            patient. Return to normal activities tomorrow.                            Written discharge instructions were provided to the                            patient.                           - Resume previous diet.                           - Continue present medications.                           - Await pathology results.                           -  Repeat colonoscopy (date not yet determined) for                            surveillance based on pathology results. Reyan Helle E. Stacia, MD 11/21/2024 9:54:55 AM This report has been signed electronically.

## 2024-11-21 NOTE — Progress Notes (Signed)
 [facility] History and Physical   Primary Care Physician:  Shona Norleen PEDLAR, MD   Reason for Procedure:   Colon cancer screening/history of polyps  Plan:    Surveillance colonoscopy   HPI: Billy Bowman is a 63 y.o. male undergoing surveillance colonoscopy.  He has no chronic GI symptoms.  Both his brother and sister were diagnosed with colon cancer. His last colonoscopy was in Oct 2022 and 5 polyps were removed, 4 of them adenomas.  The patient was provided an opportunity to ask questions and all were answered. The patient agreed with the plan   Past Medical History:  Diagnosis Date   Arthritis    Heart murmur    child   Hyperlipidemia    Hypertension    Umbilical hernia     Past Surgical History:  Procedure Laterality Date   APPENDECTOMY     COLONOSCOPY  12/08/06   normal rectum/ normal colon   HIP ARTHROPLASTY Left 5/14   LUMBAR LAMINECTOMY/DECOMPRESSION MICRODISCECTOMY  11/27/2012   Procedure: LUMBAR LAMINECTOMY/DECOMPRESSION MICRODISCECTOMY 1 LEVEL;  Surgeon: Arley SHAUNNA Helling, MD;  Location: [facility];  Service: Neurosurgery;  Laterality: Right;  right lumbar two three   LUMBAR LAMINECTOMY/DECOMPRESSION MICRODISCECTOMY Right 11/28/2013   Procedure: LUMBAR LAMINECTOMY/DECOMPRESSION MICRODISCECTOMY RIGHT LUMBAR FIVE SACRAL ONE;  Surgeon: Arley SHAUNNA Helling, MD;  Location: [facility];  Service: Neurosurgery;  Laterality: Right;    Prior to Admission medications   Medication Sig Start Date End Date Taking? Authorizing Provider  atorvastatin (LIPITOR) 40 MG tablet Take 40 mg by mouth daily. 07/06/21  Yes [provider]  hydrochlorothiazide  (HYDRODIURIL ) 25 MG tablet Take 25 mg by mouth daily.   Yes [provider]  lisinopril  (PRINIVIL ,ZESTRIL ) 20 MG tablet Take 20 mg by mouth daily.   Yes [provider]  metoprolol  succinate (TOPROL -XL) 50 MG 24 hr tablet Take 50 mg by mouth daily. Take with or immediately following a meal.   Yes [provider]  tamsulosin (FLOMAX) 0.4 MG CAPS capsule Take 0.4 mg by mouth 2 (two) times daily. 08/28/21  Yes [provider]  clonazePAM (KLONOPIN) 1 MG tablet Take 1 mg by mouth as needed.    [provider]  HYDROcodone -acetaminophen  (NORCO/VICODIN) 5-325 MG per tablet Take 1 tablet by mouth every 6 (six) hours as needed for moderate pain.    [provider]  tadalafil (CIALIS) 5 MG tablet Take 5 mg by mouth daily as needed. 10/08/24   [provider]    Current Outpatient Medications  Medication Sig Dispense Refill   atorvastatin (LIPITOR) 40 MG tablet Take 40 mg by mouth daily.     hydrochlorothiazide  (HYDRODIURIL ) 25 MG tablet Take 25 mg by mouth daily.     lisinopril  (PRINIVIL ,ZESTRIL ) 20 MG tablet Take 20 mg by mouth daily.     metoprolol  succinate (TOPROL -XL) 50 MG 24 hr tablet Take 50 mg by mouth daily. Take with or immediately following a meal.     tamsulosin (FLOMAX) 0.4 MG CAPS capsule Take 0.4 mg by mouth 2 (two) times daily.     clonazePAM (KLONOPIN) 1 MG tablet Take 1 mg by mouth as needed.     HYDROcodone -acetaminophen  (NORCO/VICODIN) 5-325 MG per tablet Take 1 tablet by mouth every 6 (six) hours as needed for moderate pain.     tadalafil (CIALIS) 5 MG tablet Take 5 mg by mouth daily as needed.     Current Facility-Administered Medications  Medication Dose Route Frequency Provider Last Rate Last Admin  0.9 %  sodium chloride  infusion  500 mL Intravenous Once Stacia Glendia BRAVO, MD        Allergies as of 11/21/2024   (No Known Allergies)    Family History  Problem Relation Age of Onset   Colon cancer Sister    Colon cancer Brother 54   Colon polyps Neg Hx    Esophageal cancer Neg Hx    Rectal cancer Neg Hx    Stomach cancer Neg Hx     Social History   Socioeconomic History   Marital status: Married    Spouse name: Not on file   Number of children: Not on file   Years of education: Not on file   Highest education level:  Not on file  Occupational History   Not on file  Tobacco Use   Smoking status: Former    Current packs/day: 0.50    Average packs/day: 0.5 packs/day for 20.0 years (10.0 ttl pk-yrs)    Types: Cigarettes   Smokeless tobacco: Never   Tobacco comments:    no maijuna last 3 months  Vaping Use   Vaping status: Never Used  Substance and Sexual Activity   Alcohol use: Yes    Comment: occasionally   Drug use: Yes    Types: Marijuana    Comment: q 2-3 wks   Sexual activity: Not on file  Other Topics Concern   Not on file  Social History Narrative   Not on file   Social Drivers of Health   Financial Resource Strain: Not on file  Food Insecurity: Not on file  Transportation Needs: Not on file  Physical Activity: Not on file  Stress: Not on file  Social Connections: Not on file  Intimate Partner Violence: Not on file    Review of Systems:  All other review of systems negative except as mentioned in the HPI.  Physical Exam: Vital signs BP (!) 176/96   Pulse (!) 54   Temp 97.9 F (36.6 C)   Ht 5' 7 (1.702 m)   Wt 204 lb (92.5 kg)   SpO2 100%   BMI 31.95 kg/m   General:   Alert,  Well-developed, well-nourished, pleasant and cooperative in NAD Airway:  Mallampati 2 Lungs:  Clear throughout to auscultation.   Heart:  Regular rate and rhythm; no murmurs, clicks, rubs,  or gallops. Abdomen:  Soft, nontender and nondistended. Normal bowel sounds.   Neuro/Psych:  Normal mood and affect. A and O x 3   Sulaiman Imbert E. Stacia, MD [facility]

## 2024-11-21 NOTE — Progress Notes (Signed)
 Called to room to assist during endoscopic procedure.  Patient ID and intended procedure confirmed with present staff. Received instructions for my participation in the procedure from the performing physician.

## 2024-11-21 NOTE — Progress Notes (Signed)
 A/o x 3, VSS, good SR's, pleased with anesthesia, report to RN

## 2024-11-21 NOTE — Progress Notes (Signed)
 Pt's states no medical or surgical changes since previsit or office visit.

## 2024-11-22 ENCOUNTER — Telehealth: Payer: Self-pay

## 2024-11-22 NOTE — Telephone Encounter (Signed)
 No answer on follow-up call. Voice mail box not set up. Unable to leave message for pt.

## 2024-11-23 LAB — SURGICAL PATHOLOGY

## 2024-11-27 ENCOUNTER — Ambulatory Visit: Payer: Self-pay | Admitting: Gastroenterology

## 2024-11-27 NOTE — Progress Notes (Signed)
 Billy Bowman,  One polyp which I removed during your recent procedure was proven to be completely benign but is considered a pre-cancerous polyp that MAY have grown into cancer if it had not been removed.  The other two polyps were not precancerous.  Studies shows that at least 20% of women over age 65 and 30% of men over age 73 have pre-cancerous polyps.  Based on your history of multiple precancerous polyps on your previous colonoscopy, I recommend that you have a repeat colonoscopy in 5 years.   If you develop any new rectal bleeding, abdominal pain or significant bowel habit changes, please contact me before then.

## 2024-11-29 ENCOUNTER — Institutional Professional Consult (permissible substitution) (INDEPENDENT_AMBULATORY_CARE_PROVIDER_SITE_OTHER): Admitting: Physician Assistant

## 2025-01-09 ENCOUNTER — Encounter (INDEPENDENT_AMBULATORY_CARE_PROVIDER_SITE_OTHER): Payer: Self-pay | Admitting: Physician Assistant

## 2025-01-09 ENCOUNTER — Ambulatory Visit (INDEPENDENT_AMBULATORY_CARE_PROVIDER_SITE_OTHER): Admitting: Physician Assistant

## 2025-01-09 VITALS — BP 165/90 | HR 56 | Temp 97.7°F | Ht 67.0 in | Wt 210.0 lb

## 2025-01-09 DIAGNOSIS — H9192 Unspecified hearing loss, left ear: Secondary | ICD-10-CM

## 2025-01-09 DIAGNOSIS — H93A2 Pulsatile tinnitus, left ear: Secondary | ICD-10-CM

## 2025-01-09 NOTE — Progress Notes (Signed)
 Patient took BP medication at around 6am.

## 2025-01-09 NOTE — Progress Notes (Signed)
 Dear Dr. Gladis, Here is my assessment for our mutual patient, Billy Bowman. Thank you for allowing me the opportunity to care for your patient. Please do not hesitate to contact me should you have any other questions. Sincerely, Chyrl Cohen PA-C  Otolaryngology Clinic Note Referring provider: Dr. Gladis HPI:  Billy Bowman is a 64 y.o. male kindly referred by Dr. Gladis   Discussed the use of AI scribe software for clinical note transcription with the patient, who gave verbal consent to proceed.  History of Present Illness   Billy Bowman is a 64 year old male with occupational noise exposure who presents with two months of left-sided pulsatile tinnitus and progressive hearing loss.  For the past two months, he has experienced persistent left-sided pulsatile tinnitus, initially occurring only at night in quiet environments but now present throughout the day. The tinnitus is synchronous with his heartbeat and intensifies with increased heart rate, significantly disrupting his sleep. He denies otalgia, vertigo, ear trauma, or prior ear infections.  He reports progressive left-sided hearing loss, which he has noticed over an extended period. He requires increased television volume and previously failed a hearing test prior to retirement, which made him aware of the decline. There are no bilateral symptoms or associated persistent neurological deficits. He denies vertigo. He has experienced transient numbness of the lip upon waking, which resolved spontaneously and was possibly positional.  He occasionally experiences a sensation in the left ear that prompts forceful coughing. This sensation improved with a course of ear drops prescribed previously; however, the tinnitus persisted. He has a remote history of anterior cervical discectomy and fusion at C4-C7 with hardware placement, as well as cervical osteophytes.           Independent Review of Additional Tests or Records:   none   PMH/Meds/All/SocHx/FamHx/ROS:   Past Medical History:  Diagnosis Date   Arthritis    Heart murmur    child   Hyperlipidemia    Hypertension    Umbilical hernia      Past Surgical History:  Procedure Laterality Date   APPENDECTOMY     COLONOSCOPY  12/08/06   normal rectum/ normal colon   HIP ARTHROPLASTY Left 5/14   LUMBAR LAMINECTOMY/DECOMPRESSION MICRODISCECTOMY  11/27/2012   Procedure: LUMBAR LAMINECTOMY/DECOMPRESSION MICRODISCECTOMY 1 LEVEL;  Surgeon: Arley SHAUNNA Helling, MD;  Location: [facility];  Service: Neurosurgery;  Laterality: Right;  right lumbar two three   LUMBAR LAMINECTOMY/DECOMPRESSION MICRODISCECTOMY Right 11/28/2013   Procedure: LUMBAR LAMINECTOMY/DECOMPRESSION MICRODISCECTOMY RIGHT LUMBAR FIVE SACRAL ONE;  Surgeon: Arley SHAUNNA Helling, MD;  Location: [facility];  Service: Neurosurgery;  Laterality: Right;    Family History  Problem Relation Age of Onset   Colon cancer Sister    Colon cancer Brother 25   Colon polyps Neg Hx    Esophageal cancer Neg Hx    Rectal cancer Neg Hx    Stomach cancer Neg Hx      Social Connections: Not on file     Current Medications[1]   Physical Exam:   BP (!) 165/90   Pulse (!) 56   Temp 97.7 F (36.5 C)   Ht 5' 7 (1.702 m)   Wt 210 lb (95.3 kg)   SpO2 98%   BMI 32.89 kg/m   Pertinent Findings  CN II-XII grossly intact Bilateral EAC clear and TM intact with well pneumatized middle ear spaces Anterior rhinoscopy: Septum midline; bilateral inferior turbinates with minimal hypertrophy No lesions of oral cavity/oropharynx; dentition wnl No obviously  palpable neck masses/lymphadenopathy/thyromegaly No respiratory distress or stridor No thrills or bruits noted on the neck       Seprately Identifiable Procedures:  None  Impression & Plans:  Billy Bowman is a 64 y.o. male with the following   Assessment and Plan    Pulsatile tinnitus, left ear  left-sided pulsatile tinnitus, synchronous with pulse - Ordered  MR IAC as well as MR angio head for evaluation - Ordered audio - Planned follow-up phone visit to review imaging and audiometric results.  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 - Ordered formal audiometric evaluation to determine degree and type of hearing loss. - Planned follow-up phone visit to discuss audiometric findings and further management.           - f/u Phone office visit after audio is complete to discuss all results    Thank you for allowing me the opportunity to care for your patient. Please do not hesitate to contact me should you have any other questions.  Sincerely, Chyrl Cohen PA-C  [facility] Phone: [phone] Fax: [phone]  01/09/2025, 11:23 AM        [1]  Current Outpatient Medications:    atorvastatin (LIPITOR) 40 MG tablet, Take 40 mg by mouth daily., Disp: , Rfl:    clonazePAM (KLONOPIN) 1 MG tablet, Take 1 mg by mouth as needed., Disp: , Rfl:    hydrochlorothiazide  (HYDRODIURIL ) 25 MG tablet, Take 25 mg by mouth daily., Disp: , Rfl:    HYDROcodone -acetaminophen  (NORCO/VICODIN) 5-325 MG per tablet, Take 1 tablet by mouth every 6 (six) hours as needed for moderate pain., Disp: , Rfl:    lisinopril  (PRINIVIL ,ZESTRIL ) 20 MG tablet, Take 20 mg by mouth daily., Disp: , Rfl:    metoprolol  succinate (TOPROL -XL) 50 MG 24 hr tablet, Take 50 mg by mouth daily. Take with or immediately following a meal., Disp: , Rfl:    tadalafil (CIALIS) 5 MG tablet, Take 5 mg by mouth daily as needed., Disp: , Rfl:    tamsulosin (FLOMAX) 0.4 MG CAPS capsule, Take 0.4 mg by mouth 2 (two) times daily., Disp: , Rfl:

## 2025-01-18 ENCOUNTER — Ambulatory Visit (INDEPENDENT_AMBULATORY_CARE_PROVIDER_SITE_OTHER): Admitting: Audiology

## 2025-01-23 ENCOUNTER — Other Ambulatory Visit (INDEPENDENT_AMBULATORY_CARE_PROVIDER_SITE_OTHER): Payer: Self-pay | Admitting: Physician Assistant

## 2025-01-23 ENCOUNTER — Telehealth (INDEPENDENT_AMBULATORY_CARE_PROVIDER_SITE_OTHER): Payer: Self-pay

## 2025-01-23 MED ORDER — DIAZEPAM 2 MG PO TABS: 2.0000 mg | ORAL_TABLET | Freq: Once | ORAL | 0 refills | Status: AC

## 2025-01-23 NOTE — Progress Notes (Signed)
 Ok with valium  for anxiety prior to MRI, discussed that he could not drive or take sedating medications/alcohol with it.

## 2025-01-23 NOTE — Telephone Encounter (Signed)
 Patient's wife called call was answered. Patient's wife stated that the patient is very anxious about his MRI on Friday and asked if PA Hedges could prescribe him anything to help with it. I explained that PA Hedges was in with a patient and that I would send a message over to him. I also let the patient and his wife know that I would call them back when it gets sent in and give them directions. They understood.

## 2025-01-23 NOTE — Telephone Encounter (Signed)
 done

## 2025-01-25 ENCOUNTER — Ambulatory Visit (HOSPITAL_COMMUNITY): Admission: RE | Admit: 2025-01-25 | Source: Ambulatory Visit

## 2025-01-25 DIAGNOSIS — H93A2 Pulsatile tinnitus, left ear: Secondary | ICD-10-CM

## 2025-01-25 MED ORDER — GADOBUTROL 1 MMOL/ML IV SOLN
10.0000 mL | Freq: Once | INTRAVENOUS | Status: AC | PRN
  Administered 2025-01-25: 10 mL via INTRAVENOUS

## 2025-02-27 ENCOUNTER — Ambulatory Visit (INDEPENDENT_AMBULATORY_CARE_PROVIDER_SITE_OTHER): Admitting: Audiology
